# Patient Record
Sex: Male | Born: 1980 | Race: White | Hispanic: No | Marital: Single | Smoking: Former smoker
Health system: Southern US, Community
[De-identification: ages and names within clinical notes are randomized; demographics above are authoritative.]

## PROBLEM LIST (undated history)

## (undated) ENCOUNTER — Ambulatory Visit: Disposition: A | Discharge status: Home / Self Care | Payer: Managed Care, Other (non HMO)

## (undated) DIAGNOSIS — Z789 Other specified health status: Secondary | ICD-10-CM

## (undated) HISTORY — PX: PENECTOMY: SHX741

## (undated) HISTORY — DX: Other specified health status: Z78.9

---

## 2015-09-22 ENCOUNTER — Encounter (HOSPITAL_COMMUNITY): Payer: Self-pay | Admitting: Emergency Medicine

## 2015-09-22 DIAGNOSIS — R0789 Other chest pain: Secondary | ICD-10-CM | POA: Insufficient documentation

## 2015-09-22 DIAGNOSIS — F17200 Nicotine dependence, unspecified, uncomplicated: Secondary | ICD-10-CM | POA: Insufficient documentation

## 2015-09-22 NOTE — ED Notes (Signed)
Pt. reports central chest pain with SOB and emesis onset last night , denies diaphoresis .

## 2015-09-23 ENCOUNTER — Emergency Department (HOSPITAL_COMMUNITY): Payer: Self-pay

## 2015-09-23 ENCOUNTER — Emergency Department (HOSPITAL_COMMUNITY)
Admission: EM | Admit: 2015-09-23 | Discharge: 2015-09-23 | Disposition: A | Discharge status: Home / Self Care | Payer: Self-pay | Attending: Emergency Medicine | Admitting: Emergency Medicine

## 2015-09-23 DIAGNOSIS — R0789 Other chest pain: Secondary | ICD-10-CM

## 2015-09-23 LAB — D-DIMER, QUANTITATIVE: D-Dimer, Quant: <0.27 ug/mL-FEU (ref 0.00–0.50)

## 2015-09-23 LAB — HEPATIC FUNCTION PANEL
ALK PHOS: 48 U/L (ref 38–126)
ALT: 23 U/L (ref 17–63)
AST: 24 U/L (ref 15–41)
Albumin: 4.3 g/dL (ref 3.5–5.0)
BILIRUBIN DIRECT: 0.1 mg/dL (ref 0.1–0.5)
Indirect Bilirubin: 1.9 mg/dL — ABNORMAL HIGH (ref 0.3–0.9)
Total Bilirubin: 2 mg/dL — ABNORMAL HIGH (ref 0.3–1.2)
Total Protein: 7.3 g/dL (ref 6.5–8.1)

## 2015-09-23 LAB — CBC
HEMATOCRIT: 49.3 % (ref 39.0–52.0)
HEMOGLOBIN: 16.7 g/dL (ref 13.0–17.0)
MCH: 29.2 pg (ref 26.0–34.0)
MCHC: 33.9 g/dL (ref 30.0–36.0)
MCV: 86.2 fL (ref 78.0–100.0)
Platelets: 319 10*3/uL (ref 150–400)
RBC: 5.72 MIL/uL (ref 4.22–5.81)
RDW: 12.4 % (ref 11.5–15.5)
WBC: 12.4 10*3/uL — ABNORMAL HIGH (ref 4.0–10.5)

## 2015-09-23 LAB — BASIC METABOLIC PANEL
ANION GAP: 14 (ref 5–15)
BUN: 12 mg/dL (ref 6–20)
CHLORIDE: 103 mmol/L (ref 101–111)
CO2: 23 mmol/L (ref 22–32)
Calcium: 9.4 mg/dL (ref 8.9–10.3)
Creatinine, Ser: 1.01 mg/dL (ref 0.61–1.24)
GFR calc Af Amer: >60 mL/min (ref >60)
GLUCOSE: 84 mg/dL (ref 65–99)
POTASSIUM: 3.6 mmol/L (ref 3.5–5.1)
Sodium: 140 mmol/L (ref 135–145)

## 2015-09-23 LAB — I-STAT TROPONIN, ED: Troponin i, poc: 0 ng/mL (ref 0.00–0.08)

## 2015-09-23 LAB — LIPASE, BLOOD: Lipase: 24 U/L (ref 11–51)

## 2015-09-23 MED ORDER — KETOROLAC TROMETHAMINE 30 MG/ML IJ SOLN
30.0000 mg | Freq: Once | INTRAMUSCULAR | Status: AC
  Administered 2015-09-23: 30 mg via INTRAVENOUS
  Filled 2015-09-23: qty 1

## 2015-09-23 MED ORDER — IBUPROFEN 600 MG PO TABS: 600.0000 mg | ORAL_TABLET | Freq: Four times a day (QID) | ORAL | Status: DC | PRN

## 2015-09-23 NOTE — ED Notes (Signed)
Patient verbalized understanding of discharge instructions and denies any further needs or questions at this time. VS stable. Patient ambulatory with steady gait.  

## 2015-09-23 NOTE — ED Provider Notes (Signed)
CSN: [id number]     Arrival date & time 09/22/15  2340 History  By signing my name below, I, Bethel Born, attest that this documentation has been prepared under the direction and in the presence of Shon Baton, MD. Electronically Signed: Bethel Born, ED Scribe. 09/23/2015. 7:37 AM   Chief Complaint  Patient presents with  . Chest Pain    The history is provided by the patient. No language interpreter was used.   Leonard Barrett is a 35 y.o. male with no significant PMHx who presents to the Emergency Department complaining of constant, 5/10 in severity, central chest pain  with onset 2 nights ago. The pain is dull generally and sharp with deep breathing. Movement exacerbates the pain. He denies recent heavy lifting. Pt denies LE swelling. No personal history of cardiac disease. No family history of early heart disease. No history of DVT/PE.  The pt is a smoker. Denies regular alcohol use. No daily medication. He is allergic to Triaminic.   History reviewed. No pertinent past medical history. Past Surgical History  Procedure Laterality Date  . Penectomy     No family history on file. Social History  Substance Use Topics  . Smoking status: Current Every Day Smoker  . Smokeless tobacco: None  . Alcohol Use: Yes    Review of Systems  Constitutional: Negative for fever.  Respiratory: Negative for cough and shortness of breath.   Cardiovascular: Positive for chest pain. Negative for leg swelling.  All other systems reviewed and are negative.     Allergies  Triaminic fever reducer  Home Medications   Prior to Admission medications   Medication Sig Start Date End Date Taking? Authorizing Provider  ibuprofen (ADVIL,MOTRIN) 600 MG tablet Take 1 tablet (600 mg total) by mouth every 6 (six) hours as needed. 09/23/15   Shon Baton, MD   BP 125/84 mmHg  Pulse 85  Temp(Src) 97.8 F (36.6 C) (Oral)  Resp 18  Ht 6' (1.829 m)  Wt 203 lb 7 oz (92.279 kg)  BMI 27.59  kg/m2  SpO2 96% Physical Exam  Constitutional: He is oriented to person, place, and time. He appears well-developed and well-nourished. No distress.  HENT:  Head: Normocephalic and atraumatic.  Cardiovascular: Normal rate, regular rhythm and normal heart sounds.   No murmur heard. Pulmonary/Chest: Effort normal and breath sounds normal. No respiratory distress. He has no wheezes. He exhibits no tenderness.  Abdominal: Soft. Bowel sounds are normal. There is no tenderness. There is no rebound.  Musculoskeletal: He exhibits no edema.  Neurological: He is alert and oriented to person, place, and time.  Skin: Skin is warm and dry.  Psychiatric: He has a normal mood and affect.  Nursing note and vitals reviewed.   ED Course  Procedures (including critical care time) DIAGNOSTIC STUDIES: Oxygen Saturation is 96% on RA,  normal by my interpretation.    COORDINATION OF CARE: 3:48 AM Discussed treatment plan which includes  with pt at bedside and pt agreed to plan.  Labs Review Labs Reviewed  CBC - Abnormal; Notable for the following:    WBC 12.4 (*)    All other components within normal limits  HEPATIC FUNCTION PANEL - Abnormal; Notable for the following:    Total Bilirubin 2.0 (*)    Indirect Bilirubin 1.9 (*)    All other components within normal limits  BASIC METABOLIC PANEL  LIPASE, BLOOD  D-DIMER, QUANTITATIVE (NOT AT [facility])  Rosezena Sensor, ED    Imaging Review Dg  Chest 2 View  09/23/2015  CLINICAL DATA:  Left upper chest pain and left shoulder pain since yesterday. EXAM: CHEST  2 VIEW COMPARISON:  None. FINDINGS: The heart size and mediastinal contours are within normal limits. Both lungs are clear. The visualized skeletal structures are unremarkable. IMPRESSION: No active cardiopulmonary disease. Electronically Signed   By: Burman Nieves M.D.   On: 09/23/2015 00:46   I have personally reviewed and evaluated these images and lab results as part of my medical  decision-making.   EKG Interpretation   Date/Time:  Monday September 22 2015 23:49:03 EST Ventricular Rate:  89 PR Interval:  140 QRS Duration: 82 QT Interval:  350 QTC Calculation: 425 R Axis:   82 Text Interpretation:  Normal sinus rhythm T wave abnormality, consider  inferior ischemia Abnormal ECG No prior for comparison Confirmed by Tanzie Rothschild   MD, Raisa Ditto ([id number]) on 09/23/2015 5:11:07 AM      MDM   Final diagnoses:  Other chest pain    Patient presents for chest pain. Some elements suggestive of musculoskeletal pain is worse with movement; however it is not reproducible on exam. He is nontoxic. Vital signs are reassuring. EKG, troponin, chest x-ray all reassuring. Screening d-dimer is negative. Patient was given Toradol with some improvement of pain.  He is low risk for ACS. Suspect musculoskeletal versus inflammatory etiology. Discussed this with the patient.  After history, exam, and medical workup I feel the patient has been appropriately medically screened and is safe for discharge home. Pertinent diagnoses were discussed with the patient. Patient was given return precautions.  I personally performed the services described in this documentation, which was scribed in my presence. The recorded information has been reviewed and is accurate.    Shon Baton, MD 09/23/15 [phone]

## 2015-09-23 NOTE — Discharge Instructions (Signed)

## 2016-04-06 ENCOUNTER — Emergency Department
Admit: 2016-04-06 | Discharge: 2016-04-06 | Disposition: A | Discharge status: Home / Self Care | Payer: Self-pay | Attending: Emergency Medicine | Admitting: Emergency Medicine

## 2016-04-06 ENCOUNTER — Emergency Department: Payer: Self-pay

## 2016-04-06 ENCOUNTER — Encounter: Payer: Self-pay | Admitting: Emergency Medicine

## 2016-04-06 ENCOUNTER — Inpatient Hospital Stay
Admission: EM | Admit: 2016-04-06 | Discharge: 2016-04-07 | DRG: 948 | Disposition: A | Discharge status: Home / Self Care | Payer: Self-pay | Attending: Internal Medicine | Admitting: Internal Medicine

## 2016-04-06 DIAGNOSIS — I319 Disease of pericardium, unspecified: Secondary | ICD-10-CM | POA: Diagnosis present

## 2016-04-06 DIAGNOSIS — Z888 Allergy status to other drugs, medicaments and biological substances status: Secondary | ICD-10-CM

## 2016-04-06 DIAGNOSIS — F17210 Nicotine dependence, cigarettes, uncomplicated: Secondary | ICD-10-CM | POA: Diagnosis present

## 2016-04-06 DIAGNOSIS — I214 Non-ST elevation (NSTEMI) myocardial infarction: Secondary | ICD-10-CM | POA: Diagnosis present

## 2016-04-06 DIAGNOSIS — Z8249 Family history of ischemic heart disease and other diseases of the circulatory system: Secondary | ICD-10-CM

## 2016-04-06 DIAGNOSIS — I4891 Unspecified atrial fibrillation: Secondary | ICD-10-CM | POA: Diagnosis present

## 2016-04-06 DIAGNOSIS — R300 Dysuria: Secondary | ICD-10-CM | POA: Diagnosis present

## 2016-04-06 DIAGNOSIS — R748 Abnormal levels of other serum enzymes: Principal | ICD-10-CM | POA: Diagnosis present

## 2016-04-06 DIAGNOSIS — Z87440 Personal history of urinary (tract) infections: Secondary | ICD-10-CM

## 2016-04-06 LAB — APTT: aPTT: 35 seconds (ref 24–36)

## 2016-04-06 LAB — BASIC METABOLIC PANEL
ANION GAP: 10 (ref 5–15)
BUN: 13 mg/dL (ref 6–20)
CALCIUM: 8.9 mg/dL (ref 8.9–10.3)
CHLORIDE: 104 mmol/L (ref 101–111)
CO2: 24 mmol/L (ref 22–32)
Creatinine, Ser: 0.95 mg/dL (ref 0.61–1.24)
GFR calc non Af Amer: >60 mL/min (ref >60)
Glucose, Bld: 95 mg/dL (ref 65–99)
Potassium: 4.1 mmol/L (ref 3.5–5.1)
SODIUM: 138 mmol/L (ref 135–145)

## 2016-04-06 LAB — URINALYSIS COMPLETE WITH MICROSCOPIC (ARMC ONLY)
Bacteria, UA: NONE SEEN
Bilirubin Urine: NEGATIVE
Glucose, UA: NEGATIVE mg/dL
HGB URINE DIPSTICK: NEGATIVE
Nitrite: NEGATIVE
PH: 6 (ref 5.0–8.0)
PROTEIN: 100 mg/dL — AB
Specific Gravity, Urine: 1.029 (ref 1.005–1.030)

## 2016-04-06 LAB — URINE DRUG SCREEN, QUALITATIVE (ARMC ONLY)
Amphetamines, Ur Screen: NOT DETECTED
BARBITURATES, UR SCREEN: NOT DETECTED
BENZODIAZEPINE, UR SCRN: NOT DETECTED
CANNABINOID 50 NG, UR ~~LOC~~: POSITIVE — AB
COCAINE METABOLITE, UR ~~LOC~~: NOT DETECTED
MDMA (Ecstasy)Ur Screen: NOT DETECTED
METHADONE SCREEN, URINE: NOT DETECTED
OPIATE, UR SCREEN: NOT DETECTED
PHENCYCLIDINE (PCP) UR S: NOT DETECTED
Tricyclic, Ur Screen: NOT DETECTED

## 2016-04-06 LAB — CBC
HCT: 47.8 % (ref 40.0–52.0)
HEMOGLOBIN: 16.3 g/dL (ref 13.0–18.0)
MCH: 29.4 pg (ref 26.0–34.0)
MCHC: 34.2 g/dL (ref 32.0–36.0)
MCV: 86 fL (ref 80.0–100.0)
Platelets: 265 10*3/uL (ref 150–440)
RBC: 5.55 MIL/uL (ref 4.40–5.90)
RDW: 13.2 % (ref 11.5–14.5)
WBC: 14.8 10*3/uL — AB (ref 3.8–10.6)

## 2016-04-06 LAB — PROTIME-INR
INR: 1.04
Prothrombin Time: 13.6 seconds (ref 11.4–15.2)

## 2016-04-06 LAB — ECHOCARDIOGRAM COMPLETE
HEIGHTINCHES: 72 in
WEIGHTICAEL: 3104 [oz_av]

## 2016-04-06 LAB — CK: Total CK: 126 U/L (ref 49–397)

## 2016-04-06 LAB — TROPONIN I
TROPONIN I: 1.39 ng/mL — AB (ref <0.03)
Troponin I: 1.58 ng/mL (ref <0.03)

## 2016-04-06 LAB — HEPARIN LEVEL (UNFRACTIONATED): HEPARIN UNFRACTIONATED: 0.16 [IU]/mL — AB (ref 0.30–0.70)

## 2016-04-06 LAB — CKMB (ARMC ONLY): CK, MB: 5.4 ng/mL — AB (ref 0.5–5.0)

## 2016-04-06 MED ORDER — HEPARIN BOLUS VIA INFUSION
4000.0000 [IU] | Freq: Once | INTRAVENOUS | Status: AC
  Administered 2016-04-06: 4000 [IU] via INTRAVENOUS
  Filled 2016-04-06: qty 4000

## 2016-04-06 MED ORDER — KETOROLAC TROMETHAMINE 15 MG/ML IJ SOLN
15.0000 mg | Freq: Four times a day (QID) | INTRAMUSCULAR | Status: DC | PRN
  Administered 2016-04-06 (×2): 15 mg via INTRAVENOUS
  Filled 2016-04-06 (×2): qty 1

## 2016-04-06 MED ORDER — IBUPROFEN 400 MG PO TABS
400.0000 mg | ORAL_TABLET | Freq: Four times a day (QID) | ORAL | Status: DC | PRN
  Administered 2016-04-06: 400 mg via ORAL
  Filled 2016-04-06: qty 1

## 2016-04-06 MED ORDER — ASPIRIN EC 81 MG PO TBEC
81.0000 mg | DELAYED_RELEASE_TABLET | Freq: Every day | ORAL | Status: DC
  Administered 2016-04-07: 81 mg via ORAL
  Filled 2016-04-06: qty 1

## 2016-04-06 MED ORDER — ASPIRIN 81 MG PO CHEW
324.0000 mg | CHEWABLE_TABLET | Freq: Once | ORAL | Status: AC
  Administered 2016-04-06: 324 mg via ORAL
  Filled 2016-04-06: qty 4

## 2016-04-06 MED ORDER — ONDANSETRON HCL 4 MG/2ML IJ SOLN: 4.0000 mg | Freq: Four times a day (QID) | INTRAMUSCULAR | Status: DC | PRN

## 2016-04-06 MED ORDER — SODIUM CHLORIDE 0.9 % IV BOLUS (SEPSIS): 1000.0000 mL | Freq: Once | INTRAVENOUS | Status: DC

## 2016-04-06 MED ORDER — SODIUM CHLORIDE 0.9% FLUSH: 3.0000 mL | INTRAVENOUS | Status: DC | PRN

## 2016-04-06 MED ORDER — HEPARIN (PORCINE) IN NACL 100-0.45 UNIT/ML-% IJ SOLN
1450.0000 [IU]/h | INTRAMUSCULAR | Status: DC
  Administered 2016-04-06: 1200 [IU]/h via INTRAVENOUS
  Filled 2016-04-06 (×3): qty 250

## 2016-04-06 MED ORDER — ALBUTEROL SULFATE (2.5 MG/3ML) 0.083% IN NEBU: 2.5000 mg | INHALATION_SOLUTION | RESPIRATORY_TRACT | Status: DC | PRN

## 2016-04-06 MED ORDER — SODIUM CHLORIDE 0.9% FLUSH
3.0000 mL | Freq: Two times a day (BID) | INTRAVENOUS | Status: DC
  Administered 2016-04-06: 3 mL via INTRAVENOUS

## 2016-04-06 MED ORDER — SODIUM CHLORIDE 0.9 % IV SOLN: 250.0000 mL | INTRAVENOUS | Status: DC | PRN

## 2016-04-06 MED ORDER — POLYETHYLENE GLYCOL 3350 17 G PO PACK: 17.0000 g | PACK | Freq: Every day | ORAL | Status: DC | PRN

## 2016-04-06 MED ORDER — DIPHENHYDRAMINE HCL 25 MG PO CAPS
25.0000 mg | ORAL_CAPSULE | Freq: Every evening | ORAL | Status: DC | PRN
  Administered 2016-04-06 (×2): 25 mg via ORAL
  Filled 2016-04-06 (×2): qty 1

## 2016-04-06 MED ORDER — ONDANSETRON HCL 4 MG PO TABS: 4.0000 mg | ORAL_TABLET | Freq: Four times a day (QID) | ORAL | Status: DC | PRN

## 2016-04-06 MED ORDER — ATORVASTATIN CALCIUM 20 MG PO TABS
40.0000 mg | ORAL_TABLET | Freq: Every day | ORAL | Status: DC
  Administered 2016-04-06: 40 mg via ORAL
  Filled 2016-04-06: qty 2

## 2016-04-06 NOTE — Progress Notes (Signed)
*  PRELIMINARY RESULTS* Echocardiogram 2D Echocardiogram has been performed.  Cristela BlueHege, Lelah Rennaker 04/06/2016, 3:18 PM

## 2016-04-06 NOTE — ED Provider Notes (Signed)
[facility] Emergency Department Provider Note  _____________________________________   First MD Initiated Contact with Patient 04/06/16 1427     (approximate)  I have reviewed the triage vital signs and the nursing notes.   HISTORY  Chief Complaint Chest Pain  HPI Leonard Barrett is a 35 y.o. male who is previously healthy until 3 nights ago when he developed headache, neck ache, chest discomfort into his back; it made it so he was unable to sleep and he developed cold sweats at night. The following day he noted that he just had chest discomfort that is worse with breathing or moving. Laying flat makes it somewhat better however there is constant pressure there. He has not had similar symptoms in the past.  [facility] for [facility] and was treated with Zithromax and presumably ceftriaxone intramuscular.  IHistory reviewed. No pertinent past medical history.  Patient Active Problem List   Diagnosis Date Noted  . NSTEMI (non-ST elevated myocardial infarction) (HCC) 04/06/2016    Past Surgical History:  Procedure Laterality Date  . PENECTOMY      Prior to Admission medications   Not on File    Allergies Triaminic fever reducer [acetaminophen]  Family History  Problem Relation Age of Onset  . CAD Neg Hx     Social History Social History  Substance Use Topics  . Smoking status: Current Every Day Smoker    Packs/day: 0.50  . Smokeless tobacco: Never Used  . Alcohol use Yes     Comment: rarely    Review of Systems Constitutional: chills/malaise Eyes: No visual changes. ENT: No sore throat. Cardiovascular: see hpi Respiratory: Denies shortness of breath. Gastrointestinal: No abdominal pain. Genitourinary: see hpi Musculoskeletal:no LE painSkin: Negative for rash. Neurological: see hpi 10-point ROS otherwise negative.  ____________________________________________   PHYSICAL EXAM:  VITAL SIGNS: ED Triage Vitals    Enc Vitals Group     BP 04/06/16 1327 116/76     Pulse Rate 04/06/16 1327 100     Resp 04/06/16 1327 20     Temp 04/06/16 1327 98.8 F (37.1 C)     Temp Source 04/06/16 1327 Oral     SpO2 04/06/16 1327 100 %     Weight 04/06/16 1328 194 lb (88 kg)     Height 04/06/16 1328 6' (1.829 m)     Head Circumference --      Peak Flow --      Pain Score 04/06/16 1328 7     Pain Loc --      Pain Edu? --      Excl. in GC? --   Constitutional: Alert and oriented. Well appearing and in no acute distress. Eyes: Conjunctivae are normal. PERRL. EOMI. Head: Atraumatic. Nose: No congestion/rhinnorhea. Mouth/Throat: Mucous membranes are moist.  Oropharynx non-erythematous. Neck: No stridor.Cardiovascular: Normal rate, regular rhythm. Grossly normal heart sounds.  Good peripheral circulation. Respiratory: Normal respiratory effort.  No retractions. Lungs CTAB. Gastrointestinal: Soft and nontender. No distention. No abdominal bruits. No CVA tenderness. Musculoskeletal: No lower extremity tenderness nor edema.  No joint effusions. Neurologic:  Normal speech and language. No gross focal neurologic deficits are appreciated. No gait instability. Skin:  Skin is warm, dry and intact. No rash noted. Psychiatric: Mood and affect are normal. Speech and behavior are normal.  ____________________________________________   LABS (all labs ordered are listed, but only abnormal results are displayed)  Labs Reviewed  CBC - Abnormal; Notable for the following:  Result Value   WBC 14.8 (*)    All other components within normal limits  TROPONIN I - Abnormal; Notable for the following:    Troponin I 1.39 (*)    All other components within normal limits  CKMB([facility] ONLY) - Abnormal; Notable for the following:    CK, MB 5.4 (*)    All other components within normal limits  URINE DRUG SCREEN, QUALITATIVE ([facility] ONLY) - Abnormal; Notable for the following:    Cannabinoid 50 Ng, Ur [place] POSITIVE (*)    All other  components within normal limits  TROPONIN I - Abnormal; Notable for the following:    Troponin I 1.58 (*)    All other components within normal limits  URINALYSIS COMPLETEWITH MICROSCOPIC ([facility] ONLY) - Abnormal; Notable for the following:    Color, Urine AMBER (*)    APPearance CLEAR (*)    Ketones, ur 1+ (*)    Protein, ur 100 (*)    Leukocytes, UA 1+ (*)    Squamous Epithelial / LPF 0-5 (*)    All other components within normal limits  URINE CULTURE  BASIC METABOLIC PANEL  APTT  CK  PROTIME-INR  TROPONIN I  HEPARIN LEVEL (UNFRACTIONATED)  CBC  BASIC METABOLIC PANEL   ____________________________________________  EKG  ED ECG REPORT I, Maurilio LovelyMcLaurin,  Elantra Caprara, the attending physician, personally viewed and interpreted this ECG.   Date: 04/06/2016  EKG Time:1325  Rate: 89  Rhythm: nsr with premature supraventricular complexes;  QRS: q v1-2  Axis: nl  Intervals: nl  ST&T Change:nl  ____________________________________________  RADIOLOGY  cxr-nad ____________________________________________    Procedures   ____________________________________________   INITIAL IMPRESSION / ASSESSMENT AND PLAN / ED COURSE  Pertinent labs & imaging results that were available during my care of the patient were reviewed by me and considered in my medical decision making (see chart for details).  Patient with no risk factors for coronary artery disease, atypical chest pain, elevated troponin; I am concerned that patient could possibly have myocarditis or pericarditis; PE is also in the differential diagnosis though he also does not have risk factors; therefore I will order a stat echocardiogram for further evaluation and plan to admit to medicine.  Clinical Course   Dr. Elpidio AnisSudini d/w Dr. Khan/cardiology; will start on heparin gtt for presumed NSTEMI.  ____________________________________________   FINAL CLINICAL IMPRESSION(S) / ED DIAGNOSES  Final diagnoses:  NSTEMI (non-ST  elevated myocardial infarction) (HCC)      NEW MEDICATIONS STARTED DURING THIS VISIT:  There are no discharge medications for this patient.    Note:  This document was prepared using Dragon voice recognition software and may include unintentional dictation errors.   Maurilio LovelyNoelle Lilas Diefendorf, MD 04/06/16 2120

## 2016-04-06 NOTE — ED Notes (Signed)
Still in echo, room 36 ED

## 2016-04-06 NOTE — ED Notes (Signed)
ECHO tech moved pt into room for echo to be done.

## 2016-04-06 NOTE — Progress Notes (Signed)
ANTICOAGULATION CONSULT NOTE - Initial Consult  Pharmacy Consult for Heparin  Indication: chest pain/ACS  Allergies  Allergen Reactions  . Triaminic Fever Reducer [Acetaminophen] Other (See Comments)    unknown    Patient Measurements: Height: 6' (182.9 cm) Weight: 194 lb (88 kg) IBW/kg (Calculated) : 77.6 Heparin Dosing Weight: 88 kg   Vital Signs: Temp: 98.8 F (37.1 C) (08/29 1327) Temp Source: Oral (08/29 1327) BP: 111/69 (08/29 1626) Pulse Rate: 103 (08/29 1626)  Labs:  Recent Labs  04/06/16 1331  HGB 16.3  HCT 47.8  PLT 265  APTT 35  CREATININE 0.95  CKTOTAL 126  CKMB 5.4*  TROPONINI 1.39*    Estimated Creatinine Clearance: 122.5 mL/min (by C-G formula based on SCr of 0.95 mg/dL).   Medical History: History reviewed. No pertinent past medical history.  Medications:  Scheduled:  . [START ON 04/07/2016] aspirin EC  81 mg Oral Daily  . atorvastatin  40 mg Oral q1800  . heparin  4,000 Units Intravenous Once  . sodium chloride flush  3 mL Intravenous Q12H  . sodium chloride flush  3 mL Intravenous Q12H    Assessment: Pharmacy consulted to dose heparin in this 35 year old male admitted with chest pain.  No prior anticoag noted.  CrCl = 122.5 ml/min  Goal of Therapy:  Heparin level 0.3-0.7 units/ml Monitor platelets by anticoagulation protocol: Yes   Plan:  Give 4000 units bolus x 1 Start heparin infusion at 1200 units/hr  Will draw 1st HL 6 hrs after start of drip.  Will order baseline INR.   Jonah Gingras D 04/06/2016,4:34 PM

## 2016-04-06 NOTE — H&P (Signed)
[facility] [place] [facility]   PATIENT NAME: Leonard Barrett    MR#:  [id number]  DATE OF BIRTH:  1980/10/31  DATE OF ADMISSION:  04/06/2016  PRIMARY CARE PHYSICIAN: No PCP Per Patient   REQUESTING/REFERRING PHYSICIAN: Dr. Glenetta Hew  CHIEF COMPLAINT:   Chief Complaint  Patient presents with  . Chest Pain   HISTORY OF PRESENT ILLNESS:  Leonard Barrett  is a 35 y.o. male with No significant past medical history presents to the emergency room complaining of chest pain on the left side for 3 days. He initially noticed some neck pain chest pain and back pain which localized to his left chest. [facility] and was diagnosed with [facility]TI and treated with antibiotics for 3 days. He continued to feel bad along with the chest pain and presented to the emergency room. Here his EKG showed anterior Q waves with no acute changes. Troponin elevated at 1.39 and patient is being admitted to the hospitalist service. He states his pain is improved on laying down and worse with sitting up.  No history of DVT or CAD.  No premature CAD in the family.  PAST MEDICAL HISTORY:  History reviewed. No pertinent past medical history.  PAST SURGICAL HISTORY:   Past Surgical History:  Procedure Laterality Date  . PENECTOMY      SOCIAL HISTORY:   Social History  Substance Use Topics  . Smoking status: Current Every Day Smoker    Packs/day: 0.50  . Smokeless tobacco: Never Used  . Alcohol use Yes     Comment: rarely    FAMILY HISTORY:   Family History  Problem Relation Age of Onset  . CAD Neg Hx     DRUG ALLERGIES:   Allergies  Allergen Reactions  . Triaminic Fever Reducer [Acetaminophen] Other (See Comments)    unknown    REVIEW OF SYSTEMS:   Review of Systems  Constitutional: Positive for malaise/fatigue. Negative for chills, fever and weight loss.  HENT: Negative for hearing loss and nosebleeds.   Eyes: Negative for blurred vision, double vision and  pain.  Respiratory: Negative for cough, hemoptysis, sputum production, shortness of breath and wheezing.   Cardiovascular: Positive for chest pain. Negative for palpitations, orthopnea and leg swelling.  Gastrointestinal: Negative for abdominal pain, constipation, diarrhea, nausea and vomiting.  Genitourinary: Negative for dysuria and hematuria.  Musculoskeletal: Negative for back pain, falls and myalgias.  Skin: Negative for rash.  Neurological: Negative for dizziness, tremors, sensory change, speech change, focal weakness, seizures and headaches.  Endo/Heme/Allergies: Does not bruise/bleed easily.  Psychiatric/Behavioral: Negative for depression and memory loss. The patient is not nervous/anxious.    MEDICATIONS AT HOME:   Prior to Admission medications   Not on File    VITAL SIGNS:  Blood pressure 111/69, pulse (!) 103, temperature 98.8 F (37.1 C), temperature source Oral, resp. rate 17, height 6' (1.829 m), weight 88 kg (194 lb), SpO2 99 %.  PHYSICAL EXAMINATION:  Physical Exam  GENERAL:  35 y.o.-year-old patient lying in the bed with no acute distress.  EYES: Pupils equal, round, reactive to light and accommodation. No scleral icterus. Extraocular muscles intact.  HEENT: Head atraumatic, normocephalic. Oropharynx and nasopharynx clear. No oropharyngeal erythema, moist oral mucosa  NECK:  Supple, no jugular venous distention. No thyroid enlargement, no tenderness.  LUNGS: Normal breath sounds bilaterally, no wheezing, rales, rhonchi. No use of accessory muscles of respiration.  CARDIOVASCULAR: S1, S2 normal. No murmurs, rubs, or gallops.  ABDOMEN: Soft, nontender, nondistended. Bowel sounds present. No organomegaly or mass.  EXTREMITIES: No pedal edema, cyanosis, or clubbing. + 2 pedal & radial pulses b/l.   NEUROLOGIC: Cranial nerves II through XII are intact. No focal Motor or sensory deficits appreciated b/l PSYCHIATRIC: The patient is alert and oriented x 3. Good affect.   SKIN: No obvious rash, lesion, or ulcer.   LABORATORY PANEL:   CBC  Recent Labs Lab 04/06/16 1331  WBC 14.8*  HGB 16.3  HCT 47.8  PLT 265   ------------------------------------------------------------------------------------------------------------------ Chemistries   Recent Labs Lab 04/06/16 1331  NA 138  K 4.1  CL 104  CO2 24  GLUCOSE 95  BUN 13  CREATININE 0.95  CALCIUM 8.9   ------------------------------------------------------------------------------------------------------------------  Cardiac Enzymes  Recent Labs Lab 04/06/16 1331  TROPONINI 1.39*   ------------------------------------------------------------------------------------------------------------------  RADIOLOGY:  Dg Chest 2 View  Result Date: 04/06/2016 CLINICAL DATA:  Shortness breath and back pain started Saturday. EXAM: CHEST  2 VIEW COMPARISON:  None. FINDINGS: The heart size and mediastinal contours are within normal limits. Both lungs are clear. The visualized skeletal structures are unremarkable. IMPRESSION: No active cardiopulmonary disease. Electronically Signed   By: Kennith CenterEric  Mansell M.D.   On: 04/06/2016 14:01   IMPRESSION AND PLAN:   * NSTEMI Elevated troponin with left sided chest pain. No significant risk factors. Discussed with cardiology Dr. Welton FlakesKhan. We'll start on heparin drip, aspirin and statin. Trend troponin. Will likely need cardiac catheterization tomorrow. If troponin is stable may consider stress test. Check echocardiogram Check urine drug screen D-dimer is normal  * Dysuria Patient was recently treated for UTI. His symptoms are improved but still has mild symptoms. We'll check a urine analysis.  * DVT prophylaxis Patient will be on heparin drip  All the records are reviewed and case discussed with ED provider. Management plans discussed with the patient, family and they are in agreement.  CODE STATUS: FULL CODE  TOTAL TIME TAKING CARE OF THIS PATIENT: 40  minutes.   Milagros LollSudini, Mercy Malena R M.D on 04/06/2016 at 4:36 PM  Between 7am to 6pm - Pager - [phone]  After 6pm go to [email or web address] - password [email or web address] [email or web address]  [facility]  Office  [phone]  CC: Primary care physician; No PCP Per Patient  Note: This dictation was prepared with Dragon dictation along with smaller phrase technology. Any transcriptional errors that result from this process are unintentional.

## 2016-04-06 NOTE — ED Notes (Signed)
RN to room where echo done.  Pt reports echo was complete one hr ago. Pt returned to hall 17 bed and placed back on monitor.

## 2016-04-06 NOTE — Progress Notes (Signed)
Notified Dr Anne HahnWIllis of patient request for a sleep aid. Benedryl ordered and given.

## 2016-04-06 NOTE — Progress Notes (Signed)
Patient admitted to unit from ER for chest pain, patient alert and oriented, Toradol  15 mg administer for pain 7/10, heparin drip 12 ml /hr, patient oriented to call light and phone system.

## 2016-04-06 NOTE — ED Notes (Signed)
Called pharmacy for heparin 

## 2016-04-06 NOTE — Progress Notes (Signed)
Leonard Barrett is a 35 y.o. male  [id number]  Primary Cardiologist: Adrian Blackwater Reason for Consultation: Chest pain  HPI: This is a 35 year old white male with a past medical history ofsignificant medical problem presented to the hospital with chest pain described as pressure type and intermittent pleuritic chest pain. His troponin was mildly elevated thus I was asked to evaluate the patient and patient still has intermittent chest pain.   Review of Systems: No orthopnea PND or leg swelling   History reviewed. No pertinent past medical history.   (Not in a hospital admission)     Infusions:   Allergies  Allergen Reactions  . Triaminic Fever Reducer [Acetaminophen] Other (See Comments)    unknown    Social History   Social History  . Marital status: Single    Spouse name: N/A  . Number of children: N/A  . Years of education: N/A   Occupational History  . Not on file.   Social History Main Topics  . Smoking status: Current Every Day Smoker    Packs/day: 0.50  . Smokeless tobacco: Never Used  . Alcohol use Yes     Comment: rarely  . Drug use:     Types: Marijuana  . Sexual activity: Not on file   Other Topics Concern  . Not on file   Social History Narrative  . No narrative on file    History reviewed. No pertinent family history.  PHYSICAL EXAM: Vitals:   04/06/16 1327 04/06/16 1425  BP: 116/76 (!) 146/94  Pulse: 100 (!) 101  Resp: 20 17  Temp: 98.8 F (37.1 C)     No intake or output data in the 24 hours ending 04/06/16 1615  General:  Well appearing. No respiratory difficulty HEENT: normal Neck: supple. no JVD. Carotids 2+ bilat; no bruits. No lymphadenopathy or thryomegaly appreciated. Cor: PMI nondisplaced. Regular rate & rhythm. No rubs, gallops or murmurs. Lungs: clear Abdomen: soft, nontender, nondistended. No hepatosplenomegaly. No bruits or masses. Good bowel sounds. Extremities: no cyanosis, clubbing, rash, edema Neuro: alert &  oriented x 3, cranial nerves grossly intact. moves all 4 extremities w/o difficulty. Affect pleasant.  NWG:NFAOZ rhythm with occasional PVC nonspecific ST-T changes  Results for orders placed or performed during the hospital encounter of 04/06/16 (from the past 24 hour(s))  Basic metabolic panel     Status: None   Collection Time: 04/06/16  1:31 PM  Result Value Ref Range   Sodium 138 135 - 145 mmol/L   Potassium 4.1 3.5 - 5.1 mmol/L   Chloride 104 101 - 111 mmol/L   CO2 24 22 - 32 mmol/L   Glucose, Bld 95 65 - 99 mg/dL   BUN 13 6 - 20 mg/dL   Creatinine, Ser 3.08 0.61 - 1.24 mg/dL   Calcium 8.9 8.9 - 65.7 mg/dL   GFR calc non Af Amer >60 >60 mL/min   GFR calc Af Amer >60 >60 mL/min   Anion gap 10 5 - 15  CBC     Status: Abnormal   Collection Time: 04/06/16  1:31 PM  Result Value Ref Range   WBC 14.8 (H) 3.8 - 10.6 K/uL   RBC 5.55 4.40 - 5.90 MIL/uL   Hemoglobin 16.3 13.0 - 18.0 g/dL   HCT 84.6 96.2 - 95.2 %   MCV 86.0 80.0 - 100.0 fL   MCH 29.4 26.0 - 34.0 pg   MCHC 34.2 32.0 - 36.0 g/dL   RDW 84.1 32.4 - 40.1 %  Platelets 265 150 - 440 K/uL  Troponin I     Status: Abnormal   Collection Time: 04/06/16  1:31 PM  Result Value Ref Range   Troponin I 1.39 (HH) <0.03 ng/mL  APTT     Status: None   Collection Time: 04/06/16  1:31 PM  Result Value Ref Range   aPTT 35 24 - 36 seconds  CKMB([facility] only)     Status: Abnormal   Collection Time: 04/06/16  1:31 PM  Result Value Ref Range   CK, MB 5.4 (H) 0.5 - 5.0 ng/mL  CK     Status: None   Collection Time: 04/06/16  1:31 PM  Result Value Ref Range   Total CK 126 49 - 397 U/L   Dg Chest 2 View  Result Date: 04/06/2016 CLINICAL DATA:  Shortness breath and back pain started Saturday. EXAM: CHEST  2 VIEW COMPARISON:  None. FINDINGS: The heart size and mediastinal contours are within normal limits. Both lungs are clear. The visualized skeletal structures are unremarkable. IMPRESSION: No active cardiopulmonary disease.  Electronically Signed   By: Kennith CenterEric  Mansell M.D.   On: 04/06/2016 14:01     ASSESSMENT AND PLAN:Atypical chest pain with mildly elevated troponin of 1.39 and some component of pleuritic chest pain with echo showing normal wall motion and normal ejection fraction. Advise admitting overnight with starting heparin and aspirin and if troponin doesn't go up much higher will do CTA coronaries in the office after discharging the patient tomorrow. Patient can be discharged on isosorbide aspirin.  Swayzie Choate A

## 2016-04-06 NOTE — ED Triage Notes (Signed)
Pt presents with chest pain x 3 days. Pt states he was recently treated for uti/std. Pt reports that he has some SOB with the chest pain. He denies hx of anxiety, although he states that he awakened with night sweats last night, which has never happened before. Pt alert & oriented. NAD noted.

## 2016-04-06 NOTE — ED Notes (Signed)
Pt notified of need urine sample

## 2016-04-07 LAB — BASIC METABOLIC PANEL
ANION GAP: 8 (ref 5–15)
BUN: 14 mg/dL (ref 6–20)
CALCIUM: 8.3 mg/dL — AB (ref 8.9–10.3)
CO2: 22 mmol/L (ref 22–32)
CREATININE: 0.8 mg/dL (ref 0.61–1.24)
Chloride: 108 mmol/L (ref 101–111)
GFR calc Af Amer: >60 mL/min (ref >60)
GLUCOSE: 100 mg/dL — AB (ref 65–99)
Potassium: 3.9 mmol/L (ref 3.5–5.1)
Sodium: 138 mmol/L (ref 135–145)

## 2016-04-07 LAB — CBC
HCT: 45.7 % (ref 40.0–52.0)
HEMOGLOBIN: 15.9 g/dL (ref 13.0–18.0)
MCH: 29.6 pg (ref 26.0–34.0)
MCHC: 34.8 g/dL (ref 32.0–36.0)
MCV: 85.1 fL (ref 80.0–100.0)
PLATELETS: 230 10*3/uL (ref 150–440)
RBC: 5.37 MIL/uL (ref 4.40–5.90)
RDW: 13.1 % (ref 11.5–14.5)
WBC: 11.7 10*3/uL — ABNORMAL HIGH (ref 3.8–10.6)

## 2016-04-07 LAB — HEPARIN LEVEL (UNFRACTIONATED): HEPARIN UNFRACTIONATED: 0.41 [IU]/mL (ref 0.30–0.70)

## 2016-04-07 LAB — TROPONIN I: Troponin I: 0.88 ng/mL (ref <0.03)

## 2016-04-07 MED ORDER — METOPROLOL TARTRATE 25 MG PO TABS: 25.0000 mg | ORAL_TABLET | Freq: Two times a day (BID) | ORAL | Status: DC

## 2016-04-07 MED ORDER — HYDROCODONE-ACETAMINOPHEN 5-325 MG PO TABS
1.0000 | ORAL_TABLET | ORAL | Status: DC | PRN
  Filled 2016-04-07: qty 1

## 2016-04-07 MED ORDER — HEPARIN BOLUS VIA INFUSION
2600.0000 [IU] | Freq: Once | INTRAVENOUS | Status: AC
  Administered 2016-04-07: 2600 [IU] via INTRAVENOUS
  Filled 2016-04-07: qty 2600

## 2016-04-07 MED ORDER — METOPROLOL TARTRATE 100 MG PO TABS
100.0000 mg | ORAL_TABLET | Freq: Once | ORAL | Status: AC
  Administered 2016-04-07: 100 mg via ORAL
  Filled 2016-04-07: qty 1

## 2016-04-07 MED ORDER — ACETAMINOPHEN 325 MG PO TABS: 650.0000 mg | ORAL_TABLET | Freq: Four times a day (QID) | ORAL | Status: DC | PRN

## 2016-04-07 MED ORDER — MORPHINE SULFATE (PF) 2 MG/ML IV SOLN: 2.0000 mg | INTRAVENOUS | Status: DC | PRN

## 2016-04-07 NOTE — Progress Notes (Addendum)
SUBJECTIVE: Pt feeling better this am, no chest pain and breathing is better at this time. He did have some waves of chest pressure over night. No palpitations, but he does sweat profusely in his sleep.   Vitals:   04/06/16 1640 04/06/16 1648 04/06/16 1720 04/07/16 0536  BP: 121/75 109/76 123/68 (!) 94/57  Pulse: 97 (!) 105 97 84  Resp: 17 19 16 16   Temp:   99 F (37.2 C)   TempSrc:   Oral   SpO2: 98% 98% 100% 97%  Weight:   188 lb 6.4 oz (85.5 kg) 186 lb 12.8 oz (84.7 kg)  Height:   6' (1.829 m)     Intake/Output Summary (Last 24 hours) at 04/07/16 0847 Last data filed at 04/07/16 0549  Gross per 24 hour  Intake                0 ml  Output              900 ml  Net             -900 ml    LABS: Basic Metabolic Panel:  Recent Labs  04/54/0908/29/17 1331 04/07/16 0653  NA 138 138  K 4.1 3.9  CL 104 108  CO2 24 22  GLUCOSE 95 100*  BUN 13 14  CREATININE 0.95 0.80  CALCIUM 8.9 8.3*   Liver Function Tests: No results for input(s): AST, ALT, ALKPHOS, BILITOT, PROT, ALBUMIN in the last 72 hours. No results for input(s): LIPASE, AMYLASE in the last 72 hours. CBC:  Recent Labs  04/06/16 1331 04/07/16 0653  WBC 14.8* 11.7*  HGB 16.3 15.9  HCT 47.8 45.7  MCV 86.0 85.1  PLT 265 230   Cardiac Enzymes:  Recent Labs  04/06/16 1331 04/06/16 1911 04/07/16 0053  CKTOTAL 126  --   --   CKMB 5.4*  --   --   TROPONINI 1.39* 1.58* 0.88*   BNP: Invalid input(s): POCBNP D-Dimer: No results for input(s): DDIMER in the last 72 hours. Hemoglobin A1C: No results for input(s): HGBA1C in the last 72 hours. Fasting Lipid Panel: No results for input(s): CHOL, HDL, LDLCALC, TRIG, CHOLHDL, LDLDIRECT in the last 72 hours. Thyroid Function Tests: No results for input(s): TSH, T4TOTAL, T3FREE, THYROIDAB in the last 72 hours.  Invalid input(s): FREET3 Anemia Panel: No results for input(s): VITAMINB12, FOLATE, FERRITIN, TIBC, IRON, RETICCTPCT in the last 72 hours.   PHYSICAL  EXAM General: Well developed, well nourished, in no acute distress HEENT:  Normocephalic and atramatic Neck:  No JVD.  Lungs: Clear bilaterally to auscultation and percussion. Heart: HRRR with occ irregular beat . Normal S1 and S2 without gallops or murmurs.  Abdomen: Bowel sounds are positive, abdomen soft and non-tender  Msk:  Back normal, normal gait. Normal strength and tone for age. Extremities: No clubbing, cyanosis or edema.   Neuro: Alert and oriented X 3. Psych:  Good affect, responds appropriately  TELEMETRY: Sinus rhythm with frequent PAC's, rates 80's and 90's. In and out of atrial fib this morning for about 10 minutes with rates up to 130's  ASSESSMENT AND PLAN: Chest pain, shortness of breath and elevated troponins in young male patient with only major risk factor is smoking. He has had sinus rhythm with PAC's on the tele monitor and this morning at 06:25 he had about 10 minutes of intermittent atrial fib in the 130's. Will add beta blocker. Will discuss with Dr. Welton FlakesKhan.  Active Problems:   NSTEMI (non-ST  elevated myocardial infarction) [facility])    [name], NP 04/07/2016 8:47 AM   After speaking with Dr. Welton Flakes advise discharge and will do CCTA in the office today. Could be myopericarditis.  Will give metoprolol tartrate 100 mg for heart rate control for testing.

## 2016-04-07 NOTE — Progress Notes (Signed)
Berton BonJanine Hammond NP notified. Pt states heparin gtt is suppose to be discontinued. Orders received. I will continue to assess.

## 2016-04-07 NOTE — Progress Notes (Signed)
ANTICOAGULATION CONSULT NOTE - Initial Consult  Pharmacy Consult for Heparin  Indication: chest pain/ACS  Allergies  Allergen Reactions  . Triaminic Fever Reducer [Acetaminophen] Other (See Comments)    unknown    Patient Measurements: Height: 6' (182.9 cm) Weight: 188 lb 6.4 oz (85.5 kg) IBW/kg (Calculated) : 77.6 Heparin Dosing Weight: 88 kg   Vital Signs: Temp: 99 F (37.2 C) (08/29 1720) Temp Source: Oral (08/29 1720) BP: 123/68 (08/29 1720) Pulse Rate: 97 (08/29 1720)  Labs:  Recent Labs  04/06/16 1331 04/06/16 1911 04/06/16 2254  HGB 16.3  --   --   HCT 47.8  --   --   PLT 265  --   --   APTT 35  --   --   LABPROT  --  13.6  --   INR  --  1.04  --   HEPARINUNFRC  --   --  0.16*  CREATININE 0.95  --   --   CKTOTAL 126  --   --   CKMB 5.4*  --   --   TROPONINI 1.39* 1.58*  --     Estimated Creatinine Clearance: 122.5 mL/min (by C-G formula based on SCr of 0.95 mg/dL).   Medical History: History reviewed. No pertinent past medical history.  Medications:  Scheduled:  . aspirin EC  81 mg Oral Daily  . atorvastatin  40 mg Oral q1800  . heparin  2,600 Units Intravenous Once  . sodium chloride  1,000 mL Intravenous Once  . sodium chloride flush  3 mL Intravenous Q12H  . sodium chloride flush  3 mL Intravenous Q12H    Assessment: Pharmacy consulted to dose heparin in this 35 year old male admitted with chest pain.  No prior anticoag noted.  CrCl = 122.5 ml/min  Goal of Therapy:  Heparin level 0.3-0.7 units/ml Monitor platelets by anticoagulation protocol: Yes   Plan:  First heparin level subtherapeutic. 2600 units IV x 1 bolus and increase rate to 1450 units/hr. Will recheck heparin level in 6 hours.  Carola FrostNathan A Harrell Niehoff, Pharm.D., BCPS Clinical Pharmacist 04/07/2016,12:55 AM

## 2016-04-07 NOTE — Discharge Summary (Signed)
[facility] [place] [facility]   PATIENT NAME: Leonard Barrett    MR#:  [id number]  DATE OF BIRTH:  1980-11-17  DATE OF ADMISSION:  04/06/2016 ADMITTING PHYSICIAN: Milagros Loll, MD  DATE OF DISCHARGE: 04/07/16  PRIMARY CARE PHYSICIAN: No PCP Per Patient    ADMISSION DIAGNOSIS:  chest pain  DISCHARGE DIAGNOSIS:  Elevated troponin myopericarditis  SECONDARY DIAGNOSIS:  History reviewed. No pertinent past medical history.  HOSPITAL COURSE:  Leonard Barrett  is a 35 y.o. male admitted 04/06/2016 with chief complaint Chest Pain . Please see H&P performed by Milagros Loll, MD for further information. Patient presented with chest pain, found to have elevated troponin started on anticoagulation for suspected NSTEMI. Patient evaluated by cardiology deemed NSTEMI unlikely, and planning on doing cardiac CT today in the office for myopericarditis.   DISCHARGE CONDITIONS:   stable  CONSULTS OBTAINED:  Treatment Team:  Laurier Nancy, MD  DRUG ALLERGIES:   Allergies  Allergen Reactions  . Triaminic Fever Reducer [Acetaminophen] Rash    Patient reports that he takes tylenol (acetaminophen) without a problem    DISCHARGE MEDICATIONS:  There are no discharge medications for this patient.    DISCHARGE INSTRUCTIONS:    DIET:  Regular diet  DISCHARGE CONDITION:  Good  ACTIVITY:  Activity as tolerated  OXYGEN:  Home Oxygen: No.   Oxygen Delivery: room air  DISCHARGE LOCATION:  home   If you experience worsening of your admission symptoms, develop shortness of breath, life threatening emergency, suicidal or homicidal thoughts you must seek medical attention immediately by calling 911 or calling your MD immediately  if symptoms less severe.  You Must read complete instructions/literature along with all the possible adverse reactions/side effects for all the Medicines you take and that have been prescribed to you. Take any new Medicines after you have  completely understood and accpet all the possible adverse reactions/side effects.   Please note  You were cared for by a hospitalist during your hospital stay. If you have any questions about your discharge medications or the care you received while you were in the hospital after you are discharged, you can call the unit and asked to speak with the hospitalist on call if the hospitalist that took care of you is not available. Once you are discharged, your primary care physician will handle any further medical issues. Please note that NO REFILLS for any discharge medications will be authorized once you are discharged, as it is imperative that you return to your primary care physician (or establish a relationship with a primary care physician if you do not have one) for your aftercare needs so that they can reassess your need for medications and monitor your lab values.    On the day of Discharge:   VITAL SIGNS:  Blood pressure 111/68, pulse 86, temperature 99 F (37.2 C), temperature source Oral, resp. rate 16, height 6' (1.829 m), weight 84.7 kg (186 lb 12.8 oz), SpO2 97 %.  I/O:   Intake/Output Summary (Last 24 hours) at 04/07/16 1048 Last data filed at 04/07/16 0549  Gross per 24 hour  Intake                0 ml  Output              900 ml  Net             -900 ml    PHYSICAL EXAMINATION:  GENERAL:  35 y.o.-year-old patient lying in  the bed with no acute distress.  EYES: Pupils equal, round, reactive to light and accommodation. No scleral icterus. Extraocular muscles intact.  HEENT: Head atraumatic, normocephalic. Oropharynx and nasopharynx clear.  NECK:  Supple, no jugular venous distention. No thyroid enlargement, no tenderness.  LUNGS: Normal breath sounds bilaterally, no wheezing, rales,rhonchi or crepitation. No use of accessory muscles of respiration.  CARDIOVASCULAR: S1, S2 normal. No murmurs, rubs, or gallops.  ABDOMEN: Soft, non-tender, non-distended. Bowel sounds present.  No organomegaly or mass.  EXTREMITIES: No pedal edema, cyanosis, or clubbing.  NEUROLOGIC: Cranial nerves II through XII are intact. Muscle strength 5/5 in all extremities. Sensation intact. Gait not checked.  PSYCHIATRIC: The patient is alert and oriented x 3.  SKIN: No obvious rash, lesion, or ulcer.   DATA REVIEW:   CBC  Recent Labs Lab 04/07/16 0653  WBC 11.7*  HGB 15.9  HCT 45.7  PLT 230    Chemistries   Recent Labs Lab 04/07/16 0653  NA 138  K 3.9  CL 108  CO2 22  GLUCOSE 100*  BUN 14  CREATININE 0.80  CALCIUM 8.3*    Cardiac Enzymes  Recent Labs Lab 04/07/16 0053  TROPONINI 0.88*    Microbiology Results  No results found for this or any previous visit.  RADIOLOGY:  Dg Chest 2 View  Result Date: 04/06/2016 CLINICAL DATA:  Shortness breath and back pain started Saturday. EXAM: CHEST  2 VIEW COMPARISON:  None. FINDINGS: The heart size and mediastinal contours are within normal limits. Both lungs are clear. The visualized skeletal structures are unremarkable. IMPRESSION: No active cardiopulmonary disease. Electronically Signed   By: Kennith CenterEric  Mansell M.D.   On: 04/06/2016 14:01     Management plans discussed with the patient, family and they are in agreement.  CODE STATUS:     Code Status Orders        Start     Ordered   04/06/16 1633  Full code  Continuous     04/06/16 1634    Code Status History    Date Active Date Inactive Code Status Order ID Comments User Context   This patient has a current code status but no historical code status.      TOTAL TIME TAKING CARE OF THIS PATIENT: 33 minutes.    Joyel Chenette,  Mardi MainlandDavid K M.D on 04/07/2016 at 10:48 AM  Between 7am to 6pm - Pager - [phone]  After 6pm go to [email or web address] - Scientist, research (life sciences)password [email or web address]  [facility] [place] [facility]  Office  [phone]  CC: Primary care physician; No PCP Per Patient

## 2016-04-07 NOTE — Progress Notes (Signed)
ANTICOAGULATION CONSULT NOTE - Initial Consult  Pharmacy Consult for Heparin  Indication: chest pain/ACS  Allergies  Allergen Reactions  . Triaminic Fever Reducer [Acetaminophen] Other (See Comments)    unknown    Patient Measurements: Height: 6' (182.9 cm) Weight: 186 lb 12.8 oz (84.7 kg) IBW/kg (Calculated) : 77.6 Heparin Dosing Weight: 88 kg   Vital Signs: BP: 94/57 (08/30 0536) Pulse Rate: 84 (08/30 0536)  Labs:  Recent Labs  04/06/16 1331 04/06/16 1911 04/06/16 2254 04/07/16 0053 04/07/16 0653  HGB 16.3  --   --   --  15.9  HCT 47.8  --   --   --  45.7  PLT 265  --   --   --  230  APTT 35  --   --   --   --   LABPROT  --  13.6  --   --   --   INR  --  1.04  --   --   --   HEPARINUNFRC  --   --  0.16*  --  0.41  CREATININE 0.95  --   --   --  0.80  CKTOTAL 126  --   --   --   --   CKMB 5.4*  --   --   --   --   TROPONINI 1.39* 1.58*  --  0.88*  --     Estimated Creatinine Clearance: 145.5 mL/min (by C-G formula based on SCr of 0.8 mg/dL).   Medical History: History reviewed. No pertinent past medical history.  Medications:  Scheduled:  . aspirin EC  81 mg Oral Daily  . atorvastatin  40 mg Oral q1800  . sodium chloride  1,000 mL Intravenous Once  . sodium chloride flush  3 mL Intravenous Q12H  . sodium chloride flush  3 mL Intravenous Q12H    Assessment: Pharmacy consulted to dose heparin in this 35 year old male admitted with chest pain.  No prior anticoag noted.  CrCl = 122.5 ml/min  Goal of Therapy:  Heparin level 0.3-0.7 units/ml Monitor platelets by anticoagulation protocol: Yes   Plan:  Current orders for heparin 1450 units/hr. Heparin level therapeutic x 1. Will check confirmatory level in 6 hours, CBC tomorrow AM.  Garlon HatchetBarefoot,Kanaya Gunnarson C, Pharm.D. Clinical Pharmacist 04/07/2016,8:08 AM

## 2016-04-08 LAB — URINE CULTURE: Culture: NO GROWTH

## 2017-03-15 IMAGING — CR DG CHEST 2V
2 series · 2 of 2 positions shown · non-contrast
Comparison: None.

CLINICAL DATA: Shortness breath and back pain started [REDACTED].

EXAM:
CHEST  2 VIEW

[chest pa]
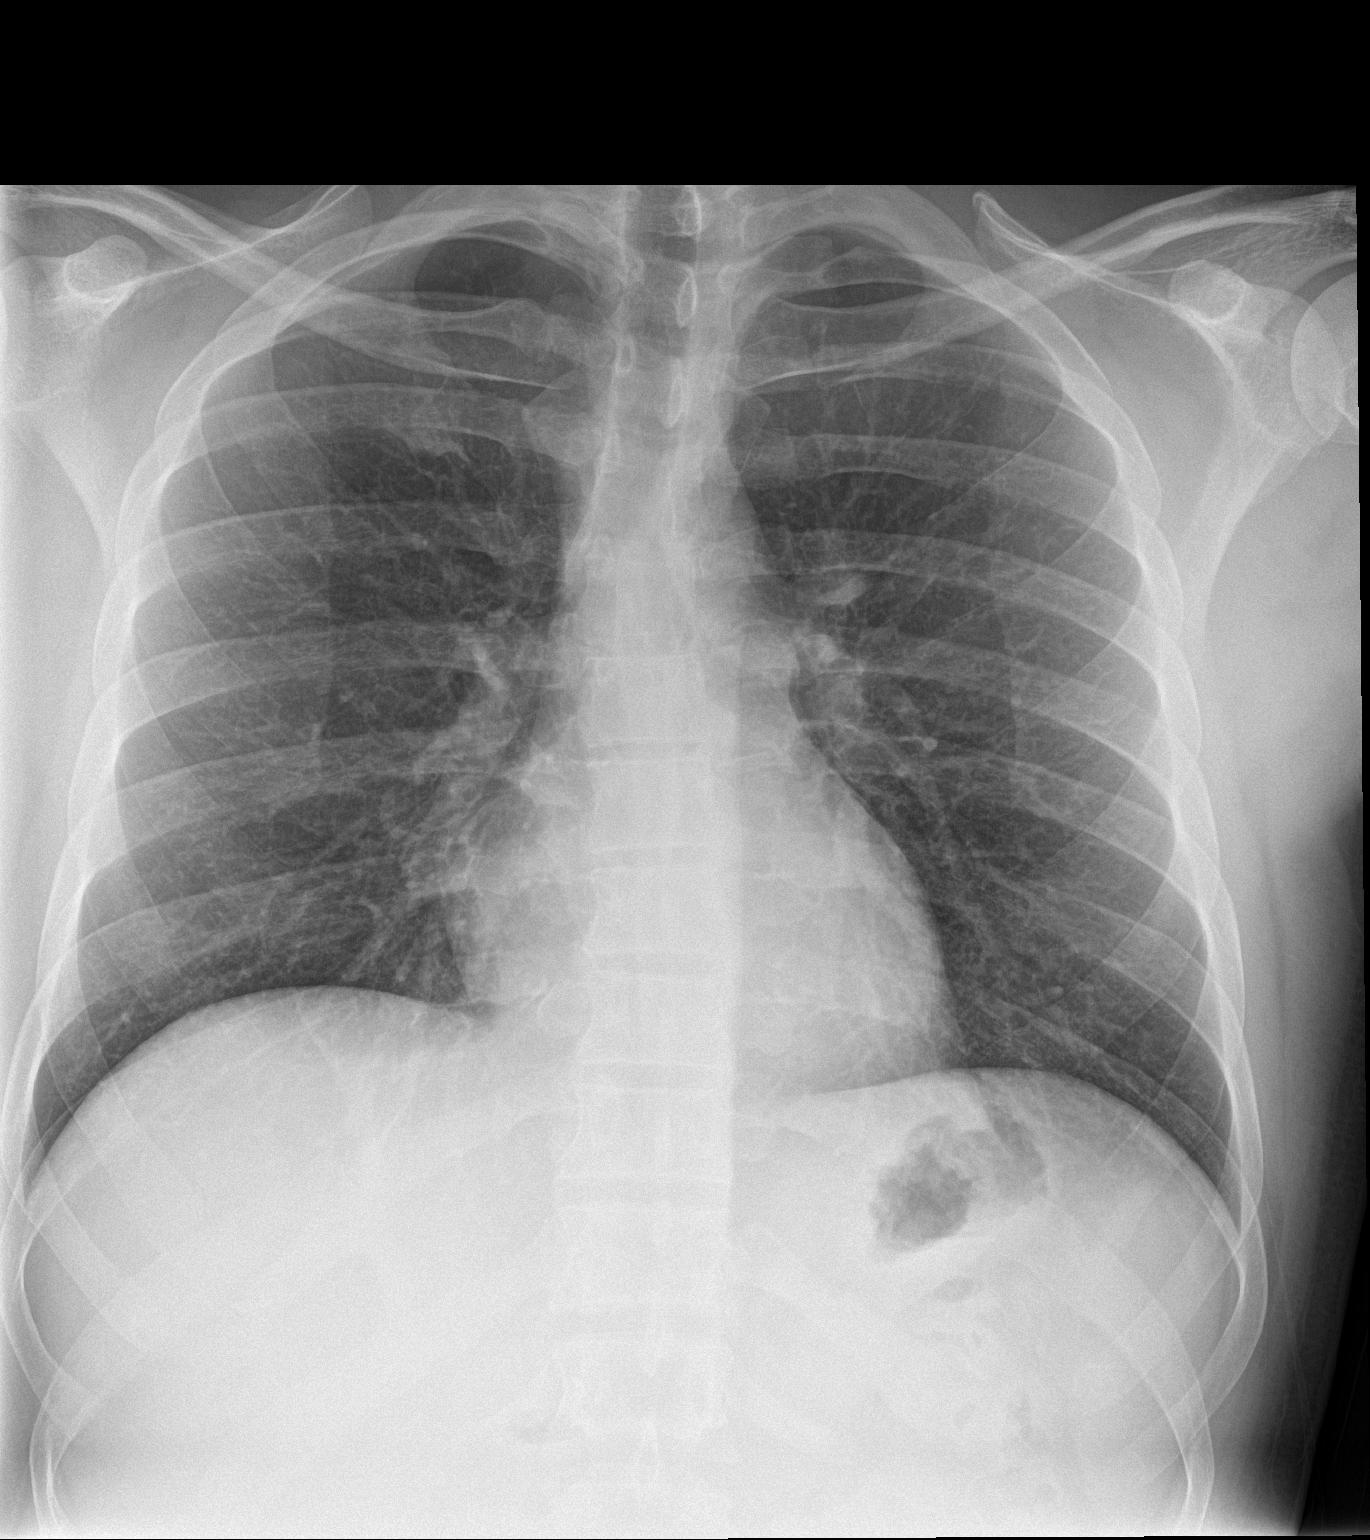

[chest lat]
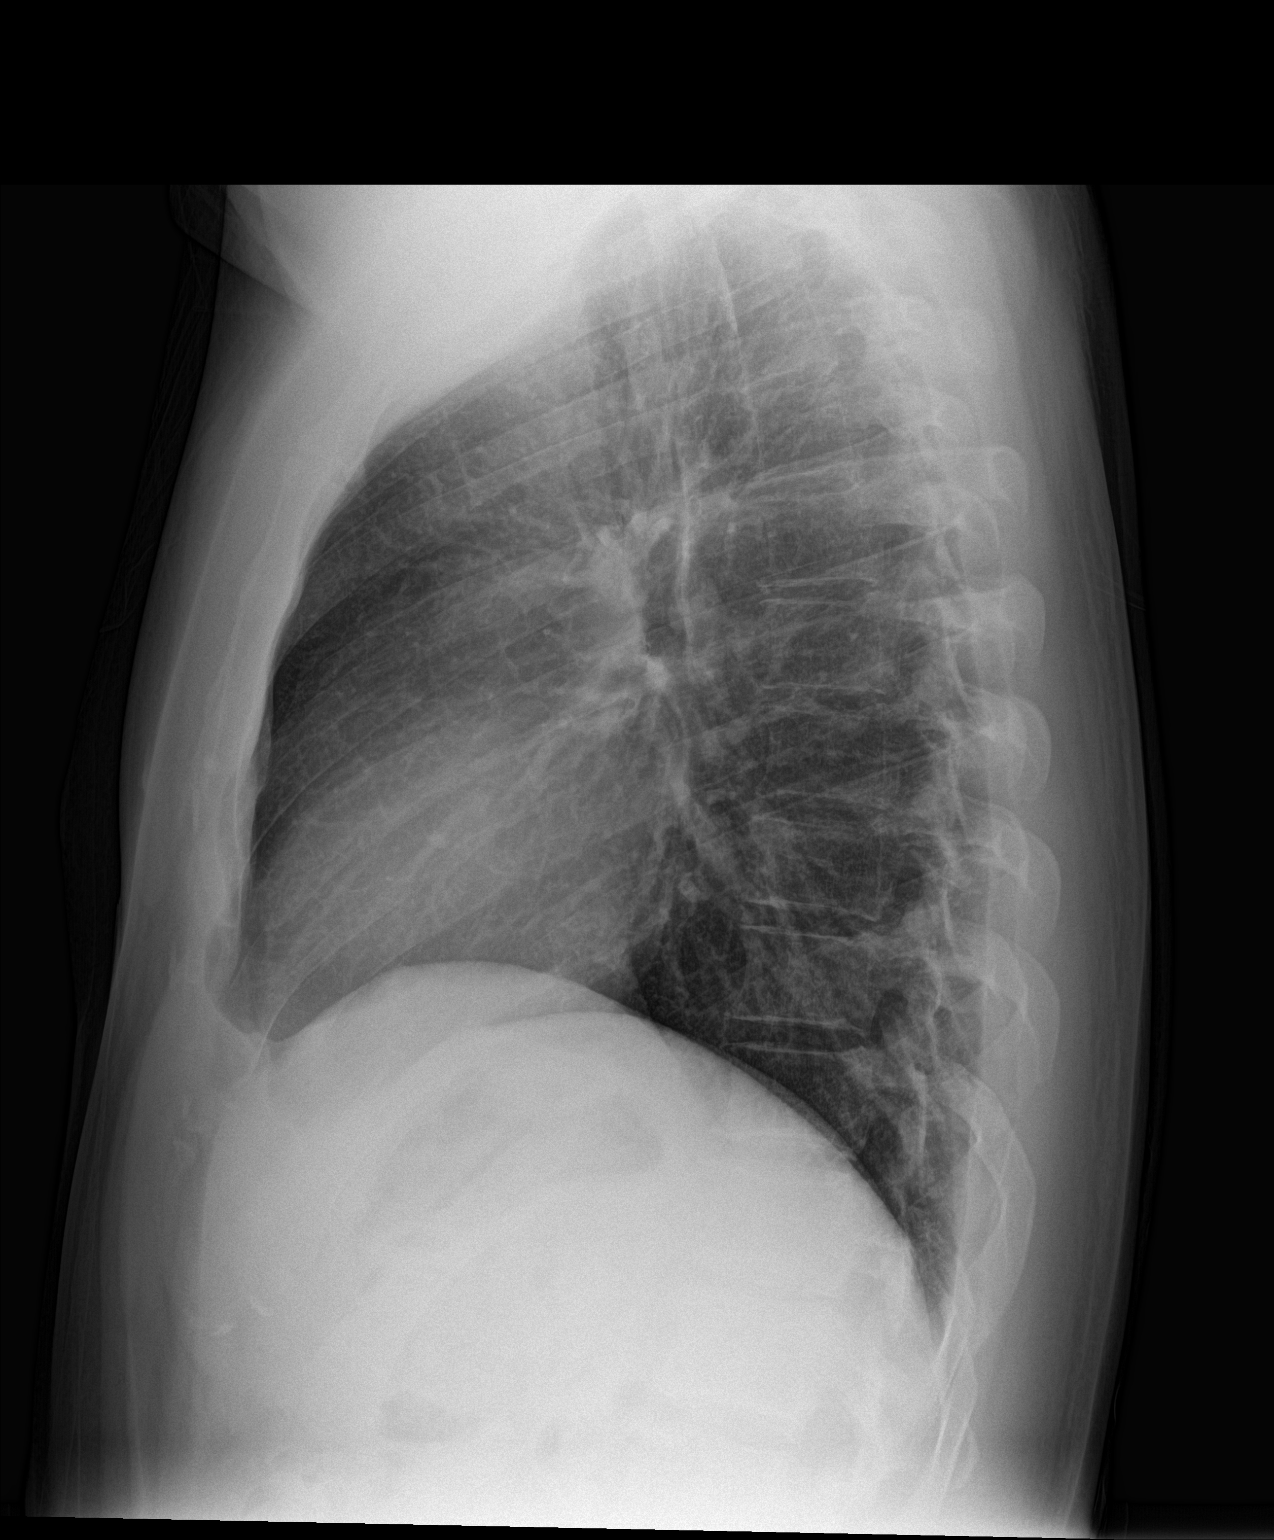

[2 of 2 positions shown; findings below may reference images not displayed]

FINDINGS: The heart size and mediastinal contours are within normal limits.
Both lungs are clear. The visualized skeletal structures are
unremarkable.
IMPRESSION: No active cardiopulmonary disease.

## 2019-09-25 ENCOUNTER — Ambulatory Visit
Admission: EM | Admit: 2019-09-25 | Discharge: 2019-09-25 | Disposition: A | Discharge status: Home / Self Care | Payer: Self-pay

## 2019-09-25 ENCOUNTER — Other Ambulatory Visit: Payer: Self-pay

## 2019-09-25 DIAGNOSIS — Z0289 Encounter for other administrative examinations: Secondary | ICD-10-CM

## 2019-09-25 DIAGNOSIS — Z7689 Persons encountering health services in other specified circumstances: Secondary | ICD-10-CM

## 2019-09-25 NOTE — ED Triage Notes (Signed)
Pt states had diarrhea and weakness last Tuesday and Wednesday. States his work told him to stay home for 10 days and needs to see a provider prior to returning to work and needs a work note. Pt denies any problems at this time.

## 2019-09-25 NOTE — ED Provider Notes (Signed)
[facility]    CSN: [id number] Arrival date & time: 09/25/19  1059      History   Chief Complaint Chief Complaint  Patient presents with  . work note    HPI Leonard Barrett is a 39 y.o. male.   39 year old male comes in for return to work note. He had diarrhea 7 days ago that lasted for 2 days. Had some weakness from diarrhea then. He has been asymptomatic for the past 6 days, but work wanted him to be quarantined for 10 days, and needed work note to return. He denies having any fever, chills, body aches during. Denies URI symptoms. Denies abdominal pain, nausea, vomiting. At the time, did have decrease in appetite. Denies shortness of breath, loss of taste/smell. Has return to normal diet since symptoms resolved.      History reviewed. No pertinent past medical history.  Patient Active Problem List   Diagnosis Date Noted  . NSTEMI (non-ST elevated myocardial infarction) (HCC) 04/06/2016    Past Surgical History:  Procedure Laterality Date  . PENECTOMY         Home Medications    Prior to Admission medications   Not on File    Family History Family History  Problem Relation Age of Onset  . CAD Neg Hx     Social History Social History   Tobacco Use  . Smoking status: Current Every Day Smoker    Packs/day: 0.50  . Smokeless tobacco: Never Used  Substance Use Topics  . Alcohol use: Yes    Comment: rarely  . Drug use: Not Currently     Allergies   Triaminic fever reducer [acetaminophen]   Review of Systems Review of Systems  Reason unable to perform ROS: See HPI as above.     Physical Exam Triage Vital Signs ED Triage Vitals  Enc Vitals Group     BP 09/25/19 1121 129/72     Pulse Rate 09/25/19 1121 70     Resp 09/25/19 1121 16     Temp 09/25/19 1121 98.5 F (36.9 C)     Temp Source 09/25/19 1121 Oral     SpO2 09/25/19 1121 96 %     Weight --      Height --      Head Circumference --      Peak Flow --      Pain Score  09/25/19 1122 0     Pain Loc --      Pain Edu? --      Excl. in GC? --    No data found.  Updated Vital Signs BP 129/72 (BP Location: Left Arm)   Pulse 70   Temp 98.5 F (36.9 C) (Oral)   Resp 16   SpO2 96%   Visual Acuity Right Eye Distance:   Left Eye Distance:   Bilateral Distance:    Right Eye Near:   Left Eye Near:    Bilateral Near:     Physical Exam Constitutional:      General: He is not in acute distress.    Appearance: Normal appearance. He is well-developed. He is not toxic-appearing or diaphoretic.  HENT:     Head: Normocephalic and atraumatic.  Eyes:     Conjunctiva/sclera: Conjunctivae normal.     Pupils: Pupils are equal, round, and reactive to light.  Cardiovascular:     Rate and Rhythm: Normal rate and regular rhythm.  Pulmonary:     Effort: Pulmonary effort is normal. No respiratory  distress.     Comments: Speaking in full sentences without difficulty. LCTAB Musculoskeletal:     Cervical back: Normal range of motion and neck supple.  Skin:    General: Skin is warm and dry.  Neurological:     Mental Status: He is alert and oriented to person, place, and time.      UC Treatments / Results  Labs (all labs ordered are listed, but only abnormal results are displayed) Labs Reviewed - No data to display  EKG   Radiology No results found.  Procedures Procedures (including critical care time)  Medications Ordered in UC Medications - No data to display  Initial Impression / Assessment and Plan / UC Course  I have reviewed the triage vital signs and the nursing notes.  Pertinent labs & imaging results that were available during my care of the patient were reviewed by me and considered in my medical decision making (see chart for details).    Patient has been asymptomatic for 6 days. Only had diarrhea during, low suspicion for COVID. Can return to work after finishing his 10 day quarantine per work unless develop new symptoms. Continue to  monitor. Patient expresses understanding and agrees to plan.  Final Clinical Impressions(s) / UC Diagnoses   Final diagnoses:  Return to work exam   ED Prescriptions    None     PDMP not reviewed this encounter.   Ok Edwards, PA-C 09/25/19 1223

## 2019-09-25 NOTE — Discharge Instructions (Signed)
Symptoms resolved, with no significant COVID like symptoms. OK to return to work. Will need reevaluation if develop new symptoms.

## 2020-10-25 ENCOUNTER — Encounter: Payer: Self-pay | Admitting: Emergency Medicine

## 2020-10-25 ENCOUNTER — Emergency Department (INDEPENDENT_AMBULATORY_CARE_PROVIDER_SITE_OTHER)
Admission: EM | Admit: 2020-10-25 | Discharge: 2020-10-25 | Disposition: A | Discharge status: Home / Self Care | Payer: Managed Care, Other (non HMO) | Source: Home / Self Care

## 2020-10-25 ENCOUNTER — Other Ambulatory Visit: Payer: Self-pay

## 2020-10-25 DIAGNOSIS — N342 Other urethritis: Secondary | ICD-10-CM

## 2020-10-25 DIAGNOSIS — Z202 Contact with and (suspected) exposure to infections with a predominantly sexual mode of transmission: Secondary | ICD-10-CM | POA: Diagnosis not present

## 2020-10-25 MED ORDER — DOXYCYCLINE HYCLATE 100 MG PO CAPS: 100.0000 mg | ORAL_CAPSULE | Freq: Two times a day (BID) | ORAL | 0 refills | Status: DC

## 2020-10-25 NOTE — Discharge Instructions (Addendum)
I have sent in doxycycline for you to take one tablet twice a day for 10 days  We will call you with any abnormal results that require further treatment  Do no have sex until results are back and negative or until treatment is completed, whichever is longer.  Follow up with this office or with primary care if symptoms are persisting.  Follow up in the ER for high fever, trouble swallowing, trouble breathing, other concerning symptoms.

## 2020-10-25 NOTE — ED Provider Notes (Signed)
[facility]    CSN: [id number] Arrival date & time: 10/25/20  1240      History   Chief Complaint Chief Complaint  Patient presents with  . Exposure to STD    HPI Leonard Barrett is a 40 y.o. male.   Reports that recent partner tested positive and was treated for chlamydia. Reports an "itch" in his urethra. States that at the end of the day he may have just a little penile drainage, vague about appearance. Denies dysuria, frequency, urgency, change in color or smell of urine, fever, chills, rash, other symptoms.  ROS per HPI  The history is provided by the patient.  Exposure to STD    History reviewed. No pertinent past medical history.  Patient Active Problem List   Diagnosis Date Noted  . NSTEMI (non-ST elevated myocardial infarction) (HCC) 04/06/2016    Past Surgical History:  Procedure Laterality Date  . PENECTOMY         Home Medications    Prior to Admission medications   Medication Sig Start Date End Date Taking? Authorizing Provider  doxycycline (VIBRAMYCIN) 100 MG capsule Take 1 capsule (100 mg total) by mouth 2 (two) times daily. 10/25/20  Yes Moshe Cipro, NP    Family History Family History  Problem Relation Age of Onset  . CAD Neg Hx     Social History Social History   Tobacco Use  . Smoking status: Current Every Day Smoker    Packs/day: 0.50  . Smokeless tobacco: Never Used  Substance Use Topics  . Alcohol use: Yes    Comment: rarely  . Drug use: Not Currently     Allergies   Other and Triaminic fever reducer [acetaminophen]   Review of Systems Review of Systems   Physical Exam Triage Vital Signs ED Triage Vitals  Enc Vitals Group     BP 10/25/20 1324 127/83     Pulse Rate 10/25/20 1324 61     Resp 10/25/20 1324 16     Temp 10/25/20 1324 98 F (36.7 C)     Temp Source 10/25/20 1324 Oral     SpO2 10/25/20 1324 98 %     Weight 10/25/20 1329 210 lb (95.3 kg)     Height 10/25/20 1329 6' (1.829 m)      Head Circumference --      Peak Flow --      Pain Score 10/25/20 1328 0     Pain Loc --      Pain Edu? --      Excl. in GC? --    No data found.  Updated Vital Signs BP 127/83 (BP Location: Right Arm)   Pulse 61   Temp 98 F (36.7 C) (Oral)   Resp 16   Ht 6' (1.829 m)   Wt 210 lb (95.3 kg)   SpO2 98%   BMI 28.48 kg/m   Visual Acuity Right Eye Distance:   Left Eye Distance:   Bilateral Distance:    Right Eye Near:   Left Eye Near:    Bilateral Near:     Physical Exam Vitals and nursing note reviewed.  Constitutional:      General: He is not in acute distress.    Appearance: Normal appearance. He is well-developed. He is not ill-appearing.  HENT:     Head: Normocephalic and atraumatic.     Nose: Nose normal.  Eyes:     Extraocular Movements: Extraocular movements intact.     Conjunctiva/sclera: Conjunctivae normal.  Pupils: Pupils are equal, round, and reactive to light.  Cardiovascular:     Rate and Rhythm: Normal rate and regular rhythm.     Heart sounds: No murmur heard.   Pulmonary:     Effort: Pulmonary effort is normal. No respiratory distress.  Abdominal:     Palpations: Abdomen is soft.     Tenderness: There is no abdominal tenderness.  Genitourinary:    Comments: Deferred  Musculoskeletal:        General: Normal range of motion.     Cervical back: Neck supple.  Skin:    General: Skin is warm and dry.     Capillary Refill: Capillary refill takes less than 2 seconds.  Neurological:     General: No focal deficit present.     Mental Status: He is alert and oriented to person, place, and time.  Psychiatric:        Mood and Affect: Mood normal.        Behavior: Behavior normal.        Thought Content: Thought content normal.      UC Treatments / Results  Labs (all labs ordered are listed, but only abnormal results are displayed) Labs Reviewed  C. TRACHOMATIS/N. GONORRHOEAE RNA    EKG   Radiology No results  found.  Procedures Procedures (including critical care time)  Medications Ordered in UC Medications - No data to display  Initial Impression / Assessment and Plan / UC Course  I have reviewed the triage vital signs and the nursing notes.  Pertinent labs & imaging results that were available during my care of the patient were reviewed by me and considered in my medical decision making (see chart for details).    STD exposure Urethritis  Urine obtained for GC/chlamydia, trichomonas testing Prescribed doxycycline 100mg  BID x 7 days for known chlamydia exposure We will be in contact with any further abnormal labs that require further treatment Abstain from sex until results are back and negative or until treatment is completed, whichever is longer Follow up with this office or with primary care if symptoms are persisting.  Follow up in the ER for high fever, trouble swallowing, trouble breathing, other concerning symptoms.   Final Clinical Impressions(s) / UC Diagnoses   Final diagnoses:  STD exposure  Urethritis     Discharge Instructions     I have sent in doxycycline for you to take one tablet twice a day for 10 days  We will call you with any abnormal results that require further treatment  Do no have sex until results are back and negative or until treatment is completed, whichever is longer.  Follow up with this office or with primary care if symptoms are persisting.  Follow up in the ER for high fever, trouble swallowing, trouble breathing, other concerning symptoms.     ED Prescriptions    Medication Sig Dispense Auth. Provider   doxycycline (VIBRAMYCIN) 100 MG capsule Take 1 capsule (100 mg total) by mouth 2 (two) times daily. 14 capsule , NP     PDMP not reviewed this encounter.   Moshe Cipro, NP 10/25/20 1418

## 2020-10-25 NOTE — ED Triage Notes (Signed)
Patient here because partner tested positive for chlamydia last week; vague about mild symptoms. States was treated for assumed STD about 5 years ago and received 'shots' and developed myocarditis with hospitalization.

## 2020-10-28 LAB — C. TRACHOMATIS/N. GONORRHOEAE RNA
C. trachomatis RNA, TMA: DETECTED — AB
N. gonorrhoeae RNA, TMA: NOT DETECTED

## 2020-11-29 NOTE — Progress Notes (Signed)
Subjective:    Leonard Barrett - 40 y.o. male MRN [id number]  Date of birth: 1981/04/06  HPI  Leonard Barrett is to establish care. Patient has a PMH significant for non-ST elevated myocardial infarction.   Current issues and/or concerns: Initially positive on 10/25/2020 he did receive and complete treatment. Currently not having any symptoms. Requesting repeat testing for chlamydia.    ROS per HPI   Health Maintenance:  Health Maintenance Due  Topic Date Due  . Hepatitis C Screening  Never done  . COVID-19 Vaccine (1) Never done  . HIV Screening  Never done  . TETANUS/TDAP  Never done    Past Medical History: Patient Active Problem List   Diagnosis Date Noted  . NSTEMI (non-ST elevated myocardial infarction) (HCC) 04/06/2016    Social History   reports that he has been smoking cigarettes. He has been smoking about 0.50 packs per day. He has never used smokeless tobacco. He reports current alcohol use of about 2.0 standard drinks of alcohol per week. He reports previous drug use.   Family History  family history includes Crohn's disease in his mother; Diabetes in his paternal grandfather; Lung disease in his father.   Medications: reviewed and updated   Objective:   Physical Exam BP 134/79 (BP Location: Left Arm, Patient Position: Sitting)   Pulse 68   Ht 6' 0.13" (1.832 m)   Wt 202 lb 6.4 oz (91.8 kg)   SpO2 97%   BMI 27.35 kg/m  Physical Exam HENT:     Head: Normocephalic and atraumatic.  Eyes:     Extraocular Movements: Extraocular movements intact.     Conjunctiva/sclera: Conjunctivae normal.     Pupils: Pupils are equal, round, and reactive to light.  Cardiovascular:     Rate and Rhythm: Normal rate and regular rhythm.     Pulses: Normal pulses.     Heart sounds: Normal heart sounds.  Pulmonary:     Effort: Pulmonary effort is normal.     Breath sounds: Normal breath sounds.  Musculoskeletal:     Cervical back: Normal range of motion and neck supple.   Neurological:     General: No focal deficit present.     Mental Status: He is alert and oriented to person, place, and time.  Psychiatric:        Mood and Affect: Mood normal.        Behavior: Behavior normal.         Assessment & Plan:  1. Encounter to establish care: - Patient presents today to establish care.  - Return for annual physical examination, labs, and health maintenance. Arrive fasting meaning having no for at least 8 hours prior to appointment. You may have only water or black coffee. Please take scheduled medications as normal.  2. Routine screening for STI (sexually transmitted infection): - Urine cytology to screen for gonorrhea, chlamydia, and trichomonas.  - Urine cytology ancillary only    Patient was given clear instructions to go to Emergency Department or return to medical center if symptoms don't improve, worsen, or new problems develop.The patient verbalized understanding.  I discussed the assessment and treatment plan with the patient. The patient was provided an opportunity to ask questions and all were answered. The patient agreed with the plan and demonstrated an understanding of the instructions.   The patient was advised to call back or seek an in-person evaluation if the symptoms worsen or if the condition fails to improve as anticipated.    Cavan Bearden Zonia Kief,  NP 12/02/2020, 11:26 AM Primary Care at [facility]

## 2020-12-01 ENCOUNTER — Other Ambulatory Visit (HOSPITAL_COMMUNITY)
Admission: RE | Admit: 2020-12-01 | Discharge: 2020-12-01 | Disposition: A | Discharge status: Home / Self Care | Payer: Managed Care, Other (non HMO) | Source: Ambulatory Visit | Attending: Family | Admitting: Family

## 2020-12-01 ENCOUNTER — Ambulatory Visit (INDEPENDENT_AMBULATORY_CARE_PROVIDER_SITE_OTHER): Payer: Managed Care, Other (non HMO) | Admitting: Family

## 2020-12-01 ENCOUNTER — Encounter: Payer: Self-pay | Admitting: Family

## 2020-12-01 VITALS — BP 134/79 | HR 68 | Ht 72.13 in | Wt 202.4 lb

## 2020-12-01 DIAGNOSIS — Z7689 Persons encountering health services in other specified circumstances: Secondary | ICD-10-CM

## 2020-12-01 DIAGNOSIS — Z113 Encounter for screening for infections with a predominantly sexual mode of transmission: Secondary | ICD-10-CM | POA: Insufficient documentation

## 2020-12-01 NOTE — Patient Instructions (Addendum)
Return for annual physical examination, labs, and health maintenance. Arrive fasting meaning having no for at least 8 hours prior to appointment. You may have only water or black coffee. Please take scheduled medications as normal. Thank you for choosing Primary Care at [facility] for your medical home!    Leonard Barrett was seen by Rema Fendt, NP today.   Leonard Barrett's primary care provider is Luc Shammas Jodi Geralds, NP.   For the best care possible,  you should try to see Ricky Stabs, NP whenever you come to clinic.   We look forward to seeing you again soon!  If you have any questions about your visit today,  please call us at [phone]  Or feel free to reach your provider via MyChart.    Chlamydia, Male Chlamydia is an STI (sexually transmitted infection) that is caused by bacteria. This infection spreads through sexual contact. Chlamydia can occur in different areas of the body, including:  The urethra. This is the part of the body that drains urine from the bladder and through the penis.  The throat.  The rectum. This condition is not difficult to treat. However, if left untreated, chlamydia can lead to more serious health problems. What are the causes? This condition is caused by the bacteria Chlamydia trachomatis. The bacteria are spread from an infected partner during sexual activity. Chlamydia can spread through contact with the genitals, mouth, or rectum. What increases the risk? The following factors may make you more likely to develop this condition:  Not using a condom correctly or not using a condom every time you have sex.  Having a new sex partner or having more than one sex partner.  Being a man who has sex with men (MSM). What are the signs or symptoms? In some cases, there are no symptoms, especially early in the infection. Having no symptoms is also called being asymptomatic. If symptoms develop, they may include:  Urinating often, or having a burning  feeling during urination.  Pain or swelling in the testicles.  Watery, mucus-like discharge from the penis.  Redness, soreness, swelling, bleeding, or discharge from the rectum. This may occur if the infection was spread through anal sex.  Pain in the abdomen.  Itching, burning, or redness in the eyes, or discharge from the eyes.  Pain during sex. How is this diagnosed? This condition may be diagnosed with:  Urine tests.  Swab tests. Depending on your symptoms, your health care provider may use a cotton swab to collect discharge from your urethra or rectum to test for the bacteria. How is this treated? This condition is treated with antibiotic medicines. Follow these instructions at home: Sexual activity  Tell your sex partner or partners about your infection. These include any partners for oral, anal, or vaginal sex that you have had within 60 days of when your symptoms started. Sex partners should also be treated, even if they have no signs of the disease.  Do not have sex until you and your sex partners have completed treatment and your health care provider says it is okay. If your health care provider prescribed you a single-dose medicine as treatment, wait at least 7 days after taking the medicine before having sex. General instructions  Take over-the-counter and prescription medicines as told by your health care provider. Finish all antibiotic medicine even when you start to feel better.  It is up to you to get your test results. Ask your health care provider, or the department that  is doing the test, when your results will be ready.  Get plenty of rest.  Eat a healthy, well-balanced diet.  Drink enough fluids to keep your urine pale yellow.  Keep all follow-up visits as told by your health care provider. This is important. You may need to be tested for infection again 3 months after treatment. How is this prevented? The only sure way to prevent chlamydia is to avoid  having vaginal, anal, and oral sex. However, you can lower your risk by:  Using latex condoms correctly every time you have sex.  Not having multiple sexual partners.  Asking if your sex partner has been tested for STIs and had negative results.  Getting regular health screenings to check for STIs.   Contact a health care provider if:  You develop new symptoms or your symptoms do not get better after you complete treatment.  You have a fever or chills.  You have pain during sex.  You develop new joint pain or swelling near your joints.  You have pain or soreness in your testicles. Get help right away if:  Your pain gets worse and does not get better with medicine.  You have abnormal discharge.  You develop flu-like symptoms, such as night sweats, sore throat, or muscle aches. Summary  Chlamydia is an STI (sexually transmitted infection) that is caused by bacteria. This infection spreads through sexual contact.  This condition is not difficult to treat. However, if left untreated, it can lead to more serious health problems.  Some people have no symptoms (are asymptomatic), especially early in the infection.  This condition is treated with antibiotic medicines.  Using latex condoms correctly every time you have sex can help prevent chlamydia. This information is not intended to replace advice given to you by your health care provider. Make sure you discuss any questions you have with your health care provider. Document Revised: 08/13/2019 Document Reviewed: 08/13/2019 Elsevier Patient Education  2021 [employer].

## 2020-12-01 NOTE — Progress Notes (Signed)
Establish care Shoulder blade pain Urine std check for Chlamydia

## 2020-12-02 LAB — URINE CYTOLOGY ANCILLARY ONLY
Chlamydia: NEGATIVE
Comment: NEGATIVE
Comment: NEGATIVE
Comment: NORMAL
Neisseria Gonorrhea: NEGATIVE
Trichomonas: NEGATIVE

## 2020-12-02 NOTE — Progress Notes (Signed)
Gonorrhea, Chlamydia, and Trichomonas negative.

## 2021-02-03 ENCOUNTER — Ambulatory Visit (INDEPENDENT_AMBULATORY_CARE_PROVIDER_SITE_OTHER): Payer: Managed Care, Other (non HMO) | Admitting: Family

## 2021-02-03 ENCOUNTER — Other Ambulatory Visit: Payer: Self-pay

## 2021-02-03 VITALS — BP 121/73 | HR 71 | Temp 98.1°F | Resp 16 | Ht 72.13 in | Wt 199.8 lb

## 2021-02-03 DIAGNOSIS — Z1329 Encounter for screening for other suspected endocrine disorder: Secondary | ICD-10-CM

## 2021-02-03 DIAGNOSIS — Z114 Encounter for screening for human immunodeficiency virus [HIV]: Secondary | ICD-10-CM

## 2021-02-03 DIAGNOSIS — Z1159 Encounter for screening for other viral diseases: Secondary | ICD-10-CM

## 2021-02-03 DIAGNOSIS — Z131 Encounter for screening for diabetes mellitus: Secondary | ICD-10-CM

## 2021-02-03 DIAGNOSIS — Z0000 Encounter for general adult medical examination without abnormal findings: Secondary | ICD-10-CM

## 2021-02-03 DIAGNOSIS — Z130 Encounter for screening for diseases of the blood and blood-forming organs and certain disorders involving the immune mechanism: Secondary | ICD-10-CM

## 2021-02-03 DIAGNOSIS — Z13228 Encounter for screening for other metabolic disorders: Secondary | ICD-10-CM

## 2021-02-03 DIAGNOSIS — Z13220 Encounter for screening for lipoid disorders: Secondary | ICD-10-CM

## 2021-02-03 NOTE — Progress Notes (Signed)
Patient ID: Leonard Barrett, male    DOB: May 12, 1981  MRN: [id number]  CC: Annual Physical Exam  Subjective: Leonard Barrett is a 40 y.o. male who presents for annual physical exam.   His concerns today include: none   Patient Active Problem List   Diagnosis Date Noted   NSTEMI (non-ST elevated myocardial infarction) ([place]) 04/06/2016     No current outpatient medications on file prior to visit.   No current facility-administered medications on file prior to visit.    Allergies  Allergen Reactions   Other Hives    Received injections x 2 for suspected std 5 years ago and within 2 days developed myocarditis and was hospitalized. Does not know name of drug.   Triaminic Fever Reducer [Acetaminophen] Rash    Patient reports that he takes tylenol (acetaminophen) without a problem    Social History   Socioeconomic History   Marital status: Single    Spouse name: Not on file   Number of children: Not on file   Years of education: Not on file   Highest education level: Not on file  Occupational History   Not on file  Tobacco Use   Smoking status: Every Day    Packs/day: 0.50    Pack years: 0.00    Types: Cigarettes   Smokeless tobacco: Never  Vaping Use   Vaping Use: Not on file  Substance and Sexual Activity   Alcohol use: Yes    Alcohol/week: 2.0 standard drinks    Types: 2 Standard drinks or equivalent per week    Comment: rarely   Drug use: Not Currently   Sexual activity: Yes  Other Topics Concern   Not on file  Social History Narrative   Not on file   Social Determinants of Health   Financial Resource Strain: Not on file  Food Insecurity: Not on file  Transportation Needs: Not on file  Physical Activity: Not on file  Stress: Not on file  Social Connections: Not on file  Intimate Partner Violence: Not on file    Family History  Problem Relation Age of Onset   Crohn's disease Mother    Lung disease Father    Diabetes Paternal Grandfather    CAD Neg Hx      Past Surgical History:  Procedure Laterality Date   PENECTOMY      ROS: Review of Systems Negative except as stated above  PHYSICAL EXAM: BP 121/73 (BP Location: Left Arm, Patient Position: Sitting, Cuff Size: Normal)   Pulse 71   Temp 98.1 F (36.7 C)   Resp 16   Wt 199 lb 12.8 oz (90.6 kg)   SpO2 96%   BMI 27.00 kg/m   Physical Exam HENT:     Head: Normocephalic and atraumatic.     Right Ear: Tympanic membrane, ear canal and external ear normal.     Left Ear: Tympanic membrane, ear canal and external ear normal.  Eyes:     Extraocular Movements: Extraocular movements intact.     Conjunctiva/sclera: Conjunctivae normal.     Pupils: Pupils are equal, round, and reactive to light.  Cardiovascular:     Rate and Rhythm: Normal rate and regular rhythm.  Pulmonary:     Effort: Pulmonary effort is normal.     Breath sounds: Normal breath sounds.  Abdominal:     General: Bowel sounds are normal.     Palpations: Abdomen is soft.  Genitourinary:    Comments: Patient declined examination.  Musculoskeletal:  General: Normal range of motion.     Cervical back: Normal range of motion and neck supple.  Skin:    General: Skin is warm and dry.     Capillary Refill: Capillary refill takes less than 2 seconds.     Comments: Eczema palmar aspect bilateral hands, present since childhood. No longer seeing Dermatology.    Neurological:     General: No focal deficit present.     Mental Status: He is alert and oriented to person, place, and time.  Psychiatric:        Mood and Affect: Mood normal.        Behavior: Behavior normal.    ASSESSMENT AND PLAN: 1. Annual physical exam: - Counseled on 150 minutes of exercise per week as tolerated, healthy eating (including decreased daily intake of saturated fats, cholesterol, added sugars, sodium), STI prevention, and routine healthcare maintenance.  2. Screening for metabolic disorder: - CMP 95+DFGI to check kidney function,  liver function, and electrolyte balance.  - CMP14+EGFR  3. Screening for deficiency anemia: - CBC to screen for anemia. - CBC  4. Diabetes mellitus screening: - Hemoglobin A1c to screen for pre-diabetes/diabetes. - Hemoglobin A1c  5. Screening cholesterol level: - Lipid panel to screen for high cholesterol.  - Lipid panel  6. Thyroid disorder screen: - TSH to check thyroid function.  - TSH  7. Need for hepatitis C screening test: - Hepatitis C antibody to screen for hepatitis C.  - Hepatitis C Antibody  8. Encounter for screening for HIV: - HIV antibody to screen for human immunodeficiency virus.  - HIV antibody (with reflex)    Patient was given the opportunity to ask questions.  Patient verbalized understanding of the plan and was able to repeat key elements of the plan. Patient was given clear instructions to go to Emergency Department or return to medical center if symptoms don't improve, worsen, or new problems develop.The patient verbalized understanding.  Follow-up with primary provider as scheduled.   Camillia Herter, NP

## 2021-02-03 NOTE — Progress Notes (Signed)
.  Pt presents for annual physical exam  

## 2021-02-03 NOTE — Patient Instructions (Signed)
Preventive Care 38-38 Years Old, Male Preventive care refers to lifestyle choices and visits with your health care provider that can promote health and wellness. This includes: A yearly physical exam. This is also called an annual wellness visit. Regular dental and eye exams. Immunizations. Screening for certain conditions. Healthy lifestyle choices, such as: Eating a healthy diet. Getting regular exercise. Not using drugs or products that contain nicotine and tobacco. Limiting alcohol use. What can I expect for my preventive care visit? Physical exam Your health care provider will check your: Height and weight. These may be used to calculate your BMI (body mass index). BMI is a measurement that tells if you are at a healthy weight. Heart rate and blood pressure. Body temperature. Skin for abnormal spots. Counseling Your health care provider may ask you questions about your: Past medical problems. Family's medical history. Alcohol, tobacco, and drug use. Emotional well-being. Home life and relationship well-being. Sexual activity. Diet, exercise, and sleep habits. Work and work environment. Access to firearms. What immunizations do I need?  Vaccines are usually given at various ages, according to a schedule. Your health care provider will recommend vaccines for you based on your age, medicalhistory, and lifestyle or other factors, such as travel or where you work. What tests do I need? Blood tests Lipid and cholesterol levels. These may be checked every 5 years, or more often if you are over 50 years old. Hepatitis C test. Hepatitis B test. Screening Lung cancer screening. You may have this screening every year starting at age 55 if you have a 30-pack-year history of smoking and currently smoke or have quit within the past 15 years. Prostate cancer screening. Recommendations will vary depending on your family history and other risks. Genital exam to check for testicular cancer  or hernias. Colorectal cancer screening. All adults should have this screening starting at age 50 and continuing until age 75. Your health care provider may recommend screening at age 45 if you are at increased risk. You will have tests every 1-10 years, depending on your results and the type of screening test. Diabetes screening. This is done by checking your blood sugar (glucose) after you have not eaten for a while (fasting). You may have this done every 1-3 years. STD (sexually transmitted disease) testing, if you are at risk. Follow these instructions at home: Eating and drinking  Eat a diet that includes fresh fruits and vegetables, whole grains, lean protein, and low-fat dairy products. Take vitamin and mineral supplements as recommended by your health care provider. Do not drink alcohol if your health care provider tells you not to drink. If you drink alcohol: Limit how much you have to 0-2 drinks a day. Be aware of how much alcohol is in your drink. In the [place] one drink equals one 12 oz bottle of beer (355 mL), one 5 oz glass of wine (148 mL), or one 1 oz glass of hard liquor (44 mL).  Lifestyle Take daily care of your teeth and gums. Brush your teeth every morning and night with fluoride toothpaste. Floss one time each day. Stay active. Exercise for at least 30 minutes 5 or more days each week. Do not use any products that contain nicotine or tobacco, such as cigarettes, e-cigarettes, and chewing tobacco. If you need help quitting, ask your health care provider. Do not use drugs. If you are sexually active, practice safe sex. Use a condom or other form of protection to prevent STIs (sexually transmitted infections). If told by   your health care provider, take low-dose aspirin daily starting at age 50. Find healthy ways to cope with stress, such as: Meditation, yoga, or listening to music. Journaling. Talking to a trusted person. Spending time with friends and  family. Safety Always wear your seat belt while driving or riding in a vehicle. Do not drive: If you have been drinking alcohol. Do not ride with someone who has been drinking. When you are tired or distracted. While texting. Wear a helmet and other protective equipment during sports activities. If you have firearms in your house, make sure you follow all gun safety procedures. What's next? Go to your health care provider once a year for an annual wellness visit. Ask your health care provider how often you should have your eyes and teeth checked. Stay up to date on all vaccines. This information is not intended to replace advice given to you by your health care provider. Make sure you discuss any questions you have with your healthcare provider. Document Revised: 04/24/2019 Document Reviewed: 07/20/2018 Elsevier Patient Education  2022 Elsevier Inc.  

## 2021-02-04 LAB — CMP14+EGFR
ALT: 28 IU/L (ref 0–44)
AST: 22 IU/L (ref 0–40)
Albumin/Globulin Ratio: 1.7 (ref 1.2–2.2)
Albumin: 4.5 g/dL (ref 4.0–5.0)
Alkaline Phosphatase: 57 IU/L (ref 44–121)
BUN/Creatinine Ratio: 13 (ref 9–20)
BUN: 12 mg/dL (ref 6–20)
Bilirubin Total: 1.5 mg/dL — ABNORMAL HIGH (ref 0.0–1.2)
CO2: 21 mmol/L (ref 20–29)
Calcium: 9.4 mg/dL (ref 8.7–10.2)
Chloride: 104 mmol/L (ref 96–106)
Creatinine, Ser: 0.91 mg/dL (ref 0.76–1.27)
Globulin, Total: 2.7 g/dL (ref 1.5–4.5)
Glucose: 78 mg/dL (ref 65–99)
Potassium: 4.4 mmol/L (ref 3.5–5.2)
Sodium: 141 mmol/L (ref 134–144)
Total Protein: 7.2 g/dL (ref 6.0–8.5)
eGFR: 111 mL/min/{1.73_m2} (ref >59)

## 2021-02-04 LAB — CBC
Hematocrit: 52 % — ABNORMAL HIGH (ref 37.5–51.0)
Hemoglobin: 17.4 g/dL (ref 13.0–17.7)
MCH: 29.3 pg (ref 26.6–33.0)
MCHC: 33.5 g/dL (ref 31.5–35.7)
MCV: 88 fL (ref 79–97)
Platelets: 348 10*3/uL (ref 150–450)
RBC: 5.94 x10E6/uL — ABNORMAL HIGH (ref 4.14–5.80)
RDW: 13 % (ref 11.6–15.4)
WBC: 11.2 10*3/uL — ABNORMAL HIGH (ref 3.4–10.8)

## 2021-02-04 LAB — LIPID PANEL
Chol/HDL Ratio: 5.5 ratio — ABNORMAL HIGH (ref 0.0–5.0)
Cholesterol, Total: 220 mg/dL — ABNORMAL HIGH (ref 100–199)
HDL: 40 mg/dL (ref >39)
LDL Chol Calc (NIH): 155 mg/dL — ABNORMAL HIGH (ref 0–99)
Triglycerides: 135 mg/dL (ref 0–149)
VLDL Cholesterol Cal: 25 mg/dL (ref 5–40)

## 2021-02-04 LAB — TSH: TSH: 1.31 u[IU]/mL (ref 0.450–4.500)

## 2021-02-04 LAB — HEPATITIS C ANTIBODY: Hep C Virus Ab: <0.1 {s_co_ratio} (ref 0.0–0.9)

## 2021-02-04 LAB — HIV ANTIBODY (ROUTINE TESTING W REFLEX): HIV Screen 4th Generation wRfx: NONREACTIVE

## 2021-02-04 LAB — HEMOGLOBIN A1C
Est. average glucose Bld gHb Est-mCnc: 100 mg/dL
Hgb A1c MFr Bld: 5.1 % (ref 4.8–5.6)

## 2021-02-04 NOTE — Progress Notes (Signed)
Please call patient with update.   Kidney function normal.   Liver function normal.   Thyroid function normal.   No diabetes.   No anemia.   Hepatitis C negative.   HIV negative.   White blood cells, sometimes called "infection fighters" mildly elevated. Encouraged to recheck in 4 to 6 weeks.   Cholesterol higher than expected. High cholesterol may increase risk of heart attack and/or stroke. Consider eating more fruits, vegetables, and lean baked meats such as chicken or fish. Moderate intensity exercise at least 150 minutes as tolerated per week may help as well. However your risk of heart attack/stroke in ten years is around average so does not need to start a medication at the moment. Encouraged to recheck in 3 to 6 months.

## 2021-04-07 ENCOUNTER — Other Ambulatory Visit: Payer: Managed Care, Other (non HMO)

## 2021-04-07 ENCOUNTER — Other Ambulatory Visit: Payer: Self-pay

## 2021-04-07 DIAGNOSIS — D72828 Other elevated white blood cell count: Secondary | ICD-10-CM

## 2021-04-08 ENCOUNTER — Other Ambulatory Visit: Payer: Self-pay | Admitting: Family

## 2021-04-08 ENCOUNTER — Telehealth: Payer: Self-pay | Admitting: Physician Assistant

## 2021-04-08 DIAGNOSIS — R718 Other abnormality of red blood cells: Secondary | ICD-10-CM

## 2021-04-08 DIAGNOSIS — D72829 Elevated white blood cell count, unspecified: Secondary | ICD-10-CM

## 2021-04-08 LAB — CBC
Hematocrit: 51.2 % — ABNORMAL HIGH (ref 37.5–51.0)
Hemoglobin: 17.5 g/dL (ref 13.0–17.7)
MCH: 29.5 pg (ref 26.6–33.0)
MCHC: 34.2 g/dL (ref 31.5–35.7)
MCV: 86 fL (ref 79–97)
Platelets: 327 10*3/uL (ref 150–450)
RBC: 5.94 x10E6/uL — ABNORMAL HIGH (ref 4.14–5.80)
RDW: 12.5 % (ref 11.6–15.4)
WBC: 12 10*3/uL — ABNORMAL HIGH (ref 3.4–10.8)

## 2021-04-08 NOTE — Telephone Encounter (Signed)
Scheduled appt per 8/31 referral. Pt aware of appt date and time.

## 2021-04-08 NOTE — Progress Notes (Signed)
White blood cells increased since 8 weeks ago. Upon review, patient has history of elevated white blood cells since at least 5 years ago. Referral to Hematology for further evaluation and management. Their office should call patient within 2 weeks with appointment details. Please note patient may receive a call from [facility]) as this is where the Hematology office is located.

## 2021-04-08 NOTE — Progress Notes (Signed)
Please call patient with update.   White blood cells increased since 8 weeks ago. Upon review, patient has history of elevated white blood cells since at least 5 years ago. Referral to Hematology for further evaluation and management. Their office should call patient within 2 weeks with appointment details. Please note patient may receive a call from [facility]) as this is where the Hematology office is located.

## 2021-04-22 ENCOUNTER — Inpatient Hospital Stay: Payer: Managed Care, Other (non HMO)

## 2021-04-22 ENCOUNTER — Inpatient Hospital Stay: Payer: Managed Care, Other (non HMO) | Attending: Physician Assistant | Admitting: Physician Assistant

## 2021-04-22 ENCOUNTER — Other Ambulatory Visit: Payer: Self-pay

## 2021-04-22 ENCOUNTER — Encounter: Payer: Self-pay | Admitting: Physician Assistant

## 2021-04-22 VITALS — BP 133/84 | HR 74 | Temp 98.2°F | Resp 17 | Ht 72.13 in | Wt 205.6 lb

## 2021-04-22 DIAGNOSIS — F17210 Nicotine dependence, cigarettes, uncomplicated: Secondary | ICD-10-CM | POA: Insufficient documentation

## 2021-04-22 DIAGNOSIS — Z9079 Acquired absence of other genital organ(s): Secondary | ICD-10-CM

## 2021-04-22 DIAGNOSIS — D72829 Elevated white blood cell count, unspecified: Secondary | ICD-10-CM

## 2021-04-22 DIAGNOSIS — D751 Secondary polycythemia: Secondary | ICD-10-CM | POA: Diagnosis not present

## 2021-04-22 LAB — CBC WITH DIFFERENTIAL (CANCER CENTER ONLY)
Abs Immature Granulocytes: 0.13 10*3/uL — ABNORMAL HIGH (ref 0.00–0.07)
Basophils Absolute: 0.1 10*3/uL (ref 0.0–0.1)
Basophils Relative: 1 %
Eosinophils Absolute: 0.1 10*3/uL (ref 0.0–0.5)
Eosinophils Relative: 1 %
HCT: 49.1 % (ref 39.0–52.0)
Hemoglobin: 17 g/dL (ref 13.0–17.0)
Immature Granulocytes: 1 %
Lymphocytes Relative: 24 %
Lymphs Abs: 2.8 10*3/uL (ref 0.7–4.0)
MCH: 29.5 pg (ref 26.0–34.0)
MCHC: 34.6 g/dL (ref 30.0–36.0)
MCV: 85.1 fL (ref 80.0–100.0)
Monocytes Absolute: 0.8 10*3/uL (ref 0.1–1.0)
Monocytes Relative: 7 %
Neutro Abs: 7.7 10*3/uL (ref 1.7–7.7)
Neutrophils Relative %: 66 %
Platelet Count: 318 10*3/uL (ref 150–400)
RBC: 5.77 MIL/uL (ref 4.22–5.81)
RDW: 12.5 % (ref 11.5–15.5)
WBC Count: 11.5 10*3/uL — ABNORMAL HIGH (ref 4.0–10.5)
nRBC: 0 % (ref 0.0–0.2)

## 2021-04-22 LAB — CMP (CANCER CENTER ONLY)
ALT: 23 U/L (ref 0–44)
AST: 19 U/L (ref 15–41)
Albumin: 4.2 g/dL (ref 3.5–5.0)
Alkaline Phosphatase: 50 U/L (ref 38–126)
Anion gap: 10 (ref 5–15)
BUN: 12 mg/dL (ref 6–20)
CO2: 23 mmol/L (ref 22–32)
Calcium: 9.4 mg/dL (ref 8.9–10.3)
Chloride: 107 mmol/L (ref 98–111)
Creatinine: 0.87 mg/dL (ref 0.61–1.24)
GFR, Estimated: >60 mL/min (ref >60)
Glucose, Bld: 89 mg/dL (ref 70–99)
Potassium: 4.1 mmol/L (ref 3.5–5.1)
Sodium: 140 mmol/L (ref 135–145)
Total Bilirubin: 2.1 mg/dL — ABNORMAL HIGH (ref 0.3–1.2)
Total Protein: 7.4 g/dL (ref 6.5–8.1)

## 2021-04-22 LAB — SAVE SMEAR(SSMR), FOR PROVIDER SLIDE REVIEW

## 2021-04-22 LAB — SEDIMENTATION RATE: Sed Rate: 0 mm/hr (ref 0–16)

## 2021-04-22 LAB — C-REACTIVE PROTEIN: CRP: 0.7 mg/dL (ref <1.0)

## 2021-04-22 NOTE — Progress Notes (Signed)
[facility] [phone]   [phone]  INITIAL CONSULT NOTE  Patient Care Team: Patient, No Pcp Per (Inactive) as PCP - General (General Practice)  Hematological/Oncological History 1) Prior Labs: -09/23/2015: WBC 12.4 (H), Hgb 16.7, Plt 319 -04/06/2016: WBC 14.8 (H), Hgb 16.3, Plt 265 -02/03/2021: WBC 11.2 (H), RBC 5.94 (H), Hgb 17.4, Hct 52.0 (H), Plt 348 -04/07/2021: WBC 12.0 (H), RBC 5.94 (H), Hgb 17.5, Hct 51.2 (H), Plt 327  2) 04/22/2021: Establish care with Georga Kaufmann PA-C  CHIEF COMPLAINTS/PURPOSE OF CONSULTATION:  "Leukocytosis and Erythrocytosis "  HISTORY OF PRESENTING ILLNESS:  Leonard Barrett 40 y.o. male with medical history significant history of tobacco use. He is unaccompanied for this visit.   On exam today, Mr. Zorn reports no changes to his energy levels or appetite.  He is able to complete all his daily activities independently.  He denies any nausea, vomiting or abdominal pain.  His bowel movements are regular without any diarrhea or constipation.  He denies any easy bruising or signs of bleeding.  He denies any fevers, chills, night sweats, shortness of breath, chest pain, cough, rash dysuria.  He has no other complaints.  Rest of the 10 point ROS is below.  MEDICAL HISTORY:  Past Medical History:  Diagnosis Date   No pertinent past medical history     SURGICAL HISTORY: Past Surgical History:  Procedure Laterality Date   PENECTOMY      SOCIAL HISTORY: Social History   Socioeconomic History   Marital status: Single    Spouse name: Not on file   Number of children: Not on file   Years of education: Not on file   Highest education level: Not on file  Occupational History   Not on file  Tobacco Use   Smoking status: Every Day    Years: 20.00    Types: Cigarettes   Smokeless tobacco: Current    Types: Chew   Tobacco comments:    Occassionally  Vaping Use   Vaping Use: Not on file  Substance and Sexual Activity    Alcohol use: Yes    Alcohol/week: 2.0 standard drinks    Types: 2 Standard drinks or equivalent per week    Comment: rarely   Drug use: Not Currently   Sexual activity: Yes  Other Topics Concern   Not on file  Social History Narrative   Not on file   Social Determinants of Health   Financial Resource Strain: Not on file  Food Insecurity: Not on file  Transportation Needs: Not on file  Physical Activity: Not on file  Stress: Not on file  Social Connections: Not on file  Intimate Partner Violence: Not on file    FAMILY HISTORY: Family History  Problem Relation Age of Onset   Crohn's disease Mother    Lung disease Father    Diabetes Paternal Grandfather    CAD Neg Hx     ALLERGIES:  is allergic to other and triaminic fever reducer [acetaminophen].  MEDICATIONS:  No current outpatient medications on file.   No current facility-administered medications for this visit.    REVIEW OF SYSTEMS:   Constitutional: ( - ) fevers, ( - )  chills , ( - ) night sweats Eyes: ( - ) blurriness of vision, ( - ) double vision, ( - ) watery eyes Ears, nose, mouth, throat, and face: ( - ) mucositis, ( - ) sore throat Respiratory: ( - ) cough, ( - ) dyspnea, ( - ) wheezes  Cardiovascular: ( - ) palpitation, ( - ) chest discomfort, ( - ) lower extremity swelling Gastrointestinal:  ( - ) nausea, ( - ) heartburn, ( - ) change in bowel habits Skin: ( - ) abnormal skin rashes Lymphatics: ( - ) new lymphadenopathy, ( - ) easy bruising Neurological: ( - ) numbness, ( - ) tingling, ( - ) new weaknesses Behavioral/Psych: ( - ) mood change, ( - ) new changes  All other systems were reviewed with the patient and are negative.  PHYSICAL EXAMINATION: ECOG PERFORMANCE STATUS: 0 - Asymptomatic  Vitals:   04/22/21 0906  BP: 133/84  Pulse: 74  Resp: 17  Temp: 98.2 F (36.8 C)  SpO2: 98%   Filed Weights   04/22/21 0906  Weight: 205 lb 9.6 oz (93.3 kg)    GENERAL: well appearing male in NAD   SKIN: skin color, texture, turgor are normal, no rashes or significant lesions EYES: conjunctiva are pink and non-injected, sclera clear OROPHARYNX: no exudate, no erythema; lips, buccal mucosa, and tongue normal  LUNGS: clear to auscultation and percussion with normal breathing effort HEART: regular rate & rhythm and no murmurs and no lower extremity edema ABDOMEN: soft, non-tender, non-distended, normal bowel sounds Musculoskeletal: no cyanosis of digits and no clubbing  PSYCH: alert & oriented x 3, fluent speech NEURO: no focal motor/sensory deficits  LABORATORY DATA:  I have reviewed the data as listed CBC Latest Ref Rng & Units 04/22/2021 04/07/2021 02/03/2021  WBC 4.0 - 10.5 K/uL 11.5(H) 12.0(H) 11.2(H)  Hemoglobin 13.0 - 17.0 g/dL 84.5 36.4 68.0  Hematocrit 39.0 - 52.0 % 49.1 51.2(H) 52.0(H)  Platelets 150 - 400 K/uL 318 327 348    CMP Latest Ref Rng & Units 02/03/2021 04/07/2016 04/06/2016  Glucose 65 - 99 mg/dL 78 321(Y) 95  BUN 6 - 20 mg/dL 12 14 13   Creatinine 0.76 - 1.27 mg/dL 2.48 2.50  Sodium 134 - 144 mmol/L 141 138 138  Potassium 3.5 - 5.2 mmol/L 4.4 3.9 4.1  Chloride 96 - 106 mmol/L 104 108 104  CO2 20 - 29 mmol/L 21 22 24   Calcium 8.7 - 10.2 mg/dL 9.4 0.37) 8.9  Total Protein 6.0 - 8.5 g/dL 7.2 - -  Total Bilirubin 0.0 - 1.2 mg/dL ) - -  Alkaline Phos 44 - 121 IU/L 57 - -  AST 0 - 40 IU/L 22 - -  ALT 0 - 44 IU/L 28 - -    ASSESSMENT & PLAN Enzo Treu is a 40 y.o. male who presents to the clinic for leukocytosis and erythrocytosis.  The differentials for leukocytosis includes active infection, inflammatory process, medications, smoking, obstructive sleep apnea or bone marrow disorders.  Patient is a chronic smoker, with history of 0.5 ppd over 20 years. Earlier this year, he started to decrease his cigarette usage. CBC from earlier today shows WBC of 11.5 and absolute immature granulocytes of 0.13. He does not take any medications at this time.He does not  have any symptoms of an active infection. Likely cause of leukocytosis is smoking. He will proceed with additional labs today to check CMP, sedimentation rate, C-reactive protein and save smear.   #Leukocytosis and erythrocytosis, mild: --CBC today shows WBC 11.5 and Abs immature granulocyte 0.13 --Likely secondary to chronic cigarette use, advised smoking cessation. --Low suspicion for bone marrow disorders.  --Labs today to check CMP, sedimentation rate, c-reactive protein and save smear --If above workup is negative, patient will return in 6 months with repeat labs.  Orders Placed This Encounter  Procedures   CBC with Differential (Cancer Center Only)    Standing Status:   Future    Number of Occurrences:   1    Standing Expiration Date:   04/22/2022   Sedimentation rate    Standing Status:   Future    Standing Expiration Date:   04/22/2022   C-reactive protein    Standing Status:   Future    Standing Expiration Date:   04/22/2022   Save Smear (SSMR)    Standing Status:   Future    Standing Expiration Date:   04/22/2022   CMP (Cancer Center only)    Standing Status:   Future    Standing Expiration Date:   04/22/2022    All questions were answered. The patient knows to call the clinic with any problems, questions or concerns.  I have spent a total of 60 minutes minutes of face-to-face and non-face-to-face time, preparing to see the patient, obtaining and/or reviewing separately obtained history, performing a medically appropriate examination, counseling and educating the patient, ordering tests, documenting clinical information in the electronic health record, and care coordination.   Georga Kaufmann, PA-C Department of Hematology/Oncology [facility] at [facility] Phone: [phone]  Patient was seen with Dr. Leonides Schanz.   I have read the above note and personally examined the patient. I agree with the assessment and plan as noted above.  Briefly Mr. Rotan is  a 40 year old male who presents for evaluation of a neutrophilic leukocytosis. At this time strongly suggest that his findings are 2/2 to smoking. Encourage cessation. Will also evaluate for inflammatory disorders. If counts worsen or if other cell levels rise will consider MPN workup. RTC in 6 months time    Ulysees Barns, MD Department of Hematology/Oncology [facility] Phone: [phone] Pager: [phone] Email: [email or web address]

## 2021-04-23 ENCOUNTER — Telehealth: Payer: Self-pay

## 2021-04-23 NOTE — Telephone Encounter (Signed)
-----   Message from Briant Cedar, PA-C sent at 04/22/2021  1:14 PM EDT ----- Please let patient know that inflammatory markers were normal. So likely cause of elevated WBC is smoking. Plan to see patient back in 6 months and encourage smoking cessation as discussed during visit.

## 2021-04-23 NOTE — Telephone Encounter (Signed)
I spoke with pt and advised as indicated. He expressed understanding of this information. °

## 2021-07-18 NOTE — Progress Notes (Signed)
Erroneous encounter

## 2021-07-21 ENCOUNTER — Ambulatory Visit: Payer: Managed Care, Other (non HMO) | Admitting: Family

## 2021-07-22 ENCOUNTER — Encounter: Payer: Managed Care, Other (non HMO) | Admitting: Family

## 2021-07-22 DIAGNOSIS — Z13220 Encounter for screening for lipoid disorders: Secondary | ICD-10-CM

## 2021-07-24 ENCOUNTER — Ambulatory Visit
Admission: EM | Admit: 2021-07-24 | Discharge: 2021-07-24 | Disposition: A | Discharge status: Home / Self Care | Payer: Managed Care, Other (non HMO) | Attending: Internal Medicine | Admitting: Internal Medicine

## 2021-07-24 ENCOUNTER — Other Ambulatory Visit: Payer: Self-pay

## 2021-07-24 DIAGNOSIS — J029 Acute pharyngitis, unspecified: Secondary | ICD-10-CM | POA: Insufficient documentation

## 2021-07-24 DIAGNOSIS — J069 Acute upper respiratory infection, unspecified: Secondary | ICD-10-CM | POA: Diagnosis present

## 2021-07-24 LAB — POCT RAPID STREP A (OFFICE): Rapid Strep A Screen: NEGATIVE

## 2021-07-24 MED ORDER — CETIRIZINE HCL 10 MG PO TABS: 10.0000 mg | ORAL_TABLET | Freq: Every day | ORAL | 0 refills | Status: DC

## 2021-07-24 MED ORDER — FLUTICASONE PROPIONATE 50 MCG/ACT NA SUSP: 1.0000 | Freq: Every day | NASAL | 0 refills | Status: DC

## 2021-07-24 MED ORDER — BENZONATATE 100 MG PO CAPS: 100.0000 mg | ORAL_CAPSULE | Freq: Three times a day (TID) | ORAL | 0 refills | Status: DC | PRN

## 2021-07-24 NOTE — ED Provider Notes (Signed)
Leonard Barrett    CSN: [id number] Arrival date & time: 07/24/21  0810      History   Chief Complaint Chief Complaint  Patient presents with   Sore Throat    HPI Farmer Mccahill is a 40 y.o. male.   Patient presents with 1 week history of nasal congestion, nonproductive cough, sore throat, body aches, fever.  T-max at home was 100 a few days prior.  Children have had similar symptoms recently.  Denies chest pain, shortness of breath, ear pain, nausea, vomiting, diarrhea, abdominal pain.  Patient has not taken any medications to help alleviate symptoms.   Sore Throat   Past Medical History:  Diagnosis Date   No pertinent past medical history     Patient Active Problem List   Diagnosis Date Noted   Leukocytosis 04/22/2021   Erythrocytosis 04/22/2021   NSTEMI (non-ST elevated myocardial infarction) (HCC) 04/06/2016    Past Surgical History:  Procedure Laterality Date   PENECTOMY         Home Medications    Prior to Admission medications   Medication Sig Start Date End Date Taking? Authorizing Provider  benzonatate (TESSALON) 100 MG capsule Take 1 capsule (100 mg total) by mouth every 8 (eight) hours as needed for cough. 07/24/21  Yes Anndrea Mihelich, Acie Fredrickson, FNP  cetirizine (ZYRTEC) 10 MG tablet Take 1 tablet (10 mg total) by mouth daily for 10 days. 07/24/21 08/03/21 Yes Wilhelmina Hark, Acie Fredrickson, FNP  fluticasone (FLONASE) 50 MCG/ACT nasal spray Place 1 spray into both nostrils daily for 3 days. 07/24/21 07/27/21 Yes Pammy Vesey, Acie Fredrickson, FNP    Family History Family History  Problem Relation Age of Onset   Crohn's disease Mother    Lung disease Father    Diabetes Paternal Grandfather    CAD Neg Hx     Social History Social History   Tobacco Use   Smoking status: Every Day    Years: 20.00    Types: Cigarettes   Smokeless tobacco: Current    Types: Chew   Tobacco comments:    Occassionally  Substance Use Topics   Alcohol use: Yes    Alcohol/week: 2.0 standard  drinks    Types: 2 Standard drinks or equivalent per week    Comment: rarely   Drug use: Not Currently     Allergies   Other and Triaminic fever reducer [acetaminophen]   Review of Systems Review of Systems Per HPI  Physical Exam Triage Vital Signs ED Triage Vitals [07/24/21 0819]  Enc Vitals Group     BP (!) 135/92     Pulse Rate 79     Resp 18     Temp 98 F (36.7 C)     Temp Source Oral     SpO2 95 %     Weight      Height      Head Circumference      Peak Flow      Pain Score 0     Pain Loc      Pain Edu?      Excl. in GC?    No data found.  Updated Vital Signs BP (!) 135/92 (BP Location: Left Arm)    Pulse 79    Temp 98 F (36.7 C) (Oral)    Resp 18    SpO2 95%   Visual Acuity Right Eye Distance:   Left Eye Distance:   Bilateral Distance:    Right Eye Near:   Left Eye Near:  Bilateral Near:     Physical Exam Constitutional:      General: He is not in acute distress.    Appearance: Normal appearance. He is not toxic-appearing or diaphoretic.  HENT:     Head: Normocephalic and atraumatic.     Right Ear: Tympanic membrane and ear canal normal.     Left Ear: Tympanic membrane and ear canal normal.     Nose: Congestion present.     Mouth/Throat:     Mouth: Mucous membranes are moist.     Pharynx: Posterior oropharyngeal erythema present.  Eyes:     Extraocular Movements: Extraocular movements intact.     Conjunctiva/sclera: Conjunctivae normal.     Pupils: Pupils are equal, round, and reactive to light.  Cardiovascular:     Rate and Rhythm: Normal rate and regular rhythm.     Pulses: Normal pulses.     Heart sounds: Normal heart sounds.  Pulmonary:     Effort: Pulmonary effort is normal. No respiratory distress.     Breath sounds: Normal breath sounds. No stridor. No wheezing, rhonchi or rales.  Abdominal:     General: Abdomen is flat. Bowel sounds are normal.     Palpations: Abdomen is soft.  Musculoskeletal:        General: Normal range  of motion.     Cervical back: Normal range of motion.  Skin:    General: Skin is warm and dry.  Neurological:     General: No focal deficit present.     Mental Status: He is alert and oriented to person, place, and time. Mental status is at baseline.  Psychiatric:        Mood and Affect: Mood normal.        Behavior: Behavior normal.     UC Treatments / Results  Labs (all labs ordered are listed, but only abnormal results are displayed) Labs Reviewed  CULTURE, GROUP A STREP ([employer])  COVID-19, FLU A+B NAA  POCT RAPID STREP A (OFFICE)    EKG   Radiology No results found.  Procedures Procedures (including critical Barrett time)  Medications Ordered in UC Medications - No data to display  Initial Impression / Assessment and Plan / UC Course  I have reviewed the triage vital signs and the nursing notes.  Pertinent labs & imaging results that were available during my Barrett of the patient were reviewed by me and considered in my medical decision making (see chart for details).     Patient presents with symptoms likely from a viral upper respiratory infection. Differential includes bacterial pneumonia, sinusitis, allergic rhinitis, COVID-19, flu. Do not suspect underlying cardiopulmonary process. Symptoms seem unlikely related to ACS, CHF or COPD exacerbations, pneumonia, pneumothorax. Patient is nontoxic appearing and not in need of emergent medical intervention.  Rapid strep is negative.  Throat culture, COVID-19, flu test pending.  Do not think that chest imaging is necessary given that there are no adventitious lung sounds or signs of respiratory compromise on exam.  Recommended symptom control with over the counter medications: Daily oral anti-histamine, Oral decongestant or IN corticosteroid, saline irrigations, cepacol lozenges, Robitussin, Delsym, honey tea.  Patient offered prescriptions.  Return if symptoms fail to improve in 1-2 weeks or you develop shortness of breath,  chest pain, severe headache. Patient states understanding and is agreeable.  Discharged with PCP followup.  Final Clinical Impressions(s) / UC Diagnoses   Final diagnoses:  Sore throat  Viral upper respiratory tract infection with cough     Discharge  Instructions      It appears that you have a viral upper respiratory infection that should resolve in the next few days with symptomatic treatment.  You have been prescribed 3 medications to help alleviate symptoms.  Rapid strep was negative.  Throat culture, COVID-19, flu test is pending.  We will call if it is positive.    ED Prescriptions     Medication Sig Dispense Auth. Provider   benzonatate (TESSALON) 100 MG capsule Take 1 capsule (100 mg total) by mouth every 8 (eight) hours as needed for cough. 21 capsule [place], [place] E, [place]   cetirizine (ZYRTEC) 10 MG tablet Take 1 tablet (10 mg total) by mouth daily for 10 days. 30 tablet [place], [place] E, [place]   fluticasone [facility]) 50 MCG/ACT nasal spray Place 1 spray into both nostrils daily for 3 days. 16 g Teodora Medici, [place]      PDMP not reviewed this encounter.   Teodora Medici, [place] 07/24/21 [phone]

## 2021-07-24 NOTE — ED Triage Notes (Signed)
Pt c/o sore throat, cough, nasal congestion, body aches, fever (89f at home).  Denies headache, chills, nausea, vomiting, diarrhea, constipation.  Onset a couple of days ago.

## 2021-07-24 NOTE — Discharge Instructions (Addendum)
It appears that you have a viral upper respiratory infection that should resolve in the next few days with symptomatic treatment.  You have been prescribed 3 medications to help alleviate symptoms.  Rapid strep was negative.  Throat culture, COVID-19, flu test is pending.  We will call if it is positive.

## 2021-07-25 LAB — COVID-19, FLU A+B NAA
Influenza A, NAA: NOT DETECTED
Influenza B, NAA: NOT DETECTED
SARS-CoV-2, NAA: NOT DETECTED

## 2021-07-27 LAB — CULTURE, GROUP A STREP (THRC)

## 2021-10-20 ENCOUNTER — Other Ambulatory Visit: Payer: Self-pay | Admitting: Physician Assistant

## 2021-10-20 DIAGNOSIS — D72829 Elevated white blood cell count, unspecified: Secondary | ICD-10-CM

## 2021-10-21 ENCOUNTER — Inpatient Hospital Stay (HOSPITAL_BASED_OUTPATIENT_CLINIC_OR_DEPARTMENT_OTHER): Payer: Managed Care, Other (non HMO) | Admitting: Physician Assistant

## 2021-10-21 ENCOUNTER — Other Ambulatory Visit: Payer: Self-pay

## 2021-10-21 ENCOUNTER — Inpatient Hospital Stay: Payer: Managed Care, Other (non HMO) | Attending: Physician Assistant

## 2021-10-21 VITALS — BP 127/82 | HR 67 | Temp 98.4°F | Resp 17 | Wt 215.5 lb

## 2021-10-21 DIAGNOSIS — D72829 Elevated white blood cell count, unspecified: Secondary | ICD-10-CM | POA: Insufficient documentation

## 2021-10-21 DIAGNOSIS — Z87891 Personal history of nicotine dependence: Secondary | ICD-10-CM | POA: Diagnosis not present

## 2021-10-21 LAB — CMP (CANCER CENTER ONLY)
ALT: 36 U/L (ref 0–44)
AST: 25 U/L (ref 15–41)
Albumin: 4.3 g/dL (ref 3.5–5.0)
Alkaline Phosphatase: 50 U/L (ref 38–126)
Anion gap: 4 — ABNORMAL LOW (ref 5–15)
BUN: 12 mg/dL (ref 6–20)
CO2: 29 mmol/L (ref 22–32)
Calcium: 9.3 mg/dL (ref 8.9–10.3)
Chloride: 106 mmol/L (ref 98–111)
Creatinine: 0.92 mg/dL (ref 0.61–1.24)
GFR, Estimated: >60 mL/min (ref >60)
Glucose, Bld: 93 mg/dL (ref 70–99)
Potassium: 4.3 mmol/L (ref 3.5–5.1)
Sodium: 139 mmol/L (ref 135–145)
Total Bilirubin: 1.4 mg/dL — ABNORMAL HIGH (ref 0.3–1.2)
Total Protein: 7.5 g/dL (ref 6.5–8.1)

## 2021-10-21 LAB — CBC WITH DIFFERENTIAL (CANCER CENTER ONLY)
Abs Immature Granulocytes: 0.19 10*3/uL — ABNORMAL HIGH (ref 0.00–0.07)
Basophils Absolute: 0.1 10*3/uL (ref 0.0–0.1)
Basophils Relative: 1 %
Eosinophils Absolute: 0.3 10*3/uL (ref 0.0–0.5)
Eosinophils Relative: 3 %
HCT: 49.4 % (ref 39.0–52.0)
Hemoglobin: 16.5 g/dL (ref 13.0–17.0)
Immature Granulocytes: 2 %
Lymphocytes Relative: 28 %
Lymphs Abs: 3 10*3/uL (ref 0.7–4.0)
MCH: 28.4 pg (ref 26.0–34.0)
MCHC: 33.4 g/dL (ref 30.0–36.0)
MCV: 85 fL (ref 80.0–100.0)
Monocytes Absolute: 0.7 10*3/uL (ref 0.1–1.0)
Monocytes Relative: 7 %
Neutro Abs: 6.6 10*3/uL (ref 1.7–7.7)
Neutrophils Relative %: 59 %
Platelet Count: 334 10*3/uL (ref 150–400)
RBC: 5.81 MIL/uL (ref 4.22–5.81)
RDW: 12.7 % (ref 11.5–15.5)
WBC Count: 10.9 10*3/uL — ABNORMAL HIGH (ref 4.0–10.5)
nRBC: 0 % (ref 0.0–0.2)

## 2021-10-21 NOTE — Progress Notes (Signed)
?[place] ?[phone]   [phone] ? ?PROGRESS NOTE ? ?Patient Care Team: ?Camillia Herter, NP as PCP - General (Nurse Practitioner) ? ?Hematological/Oncological History ?1) Prior Labs: ?-09/23/2015: WBC 12.4 (H), Hgb 16.7, Plt 319 ?-04/06/2016: WBC 14.8 (H), Hgb 16.3, Plt 265 ?-02/03/2021: WBC 11.2 (H), RBC 5.94 (H), Hgb 17.4, Hct 52.0 (H), Plt 348 ?-04/07/2021: WBC 12.0 (H), RBC 5.94 (H), Hgb 17.5, Hct 51.2 (H), Plt 327 ? ?2) 04/22/2021: Establish care with Dede Query PA-C ? ?CHIEF COMPLAINTS/PURPOSE OF CONSULTATION:  ?Leukocytosis ? ?HISTORY OF PRESENTING ILLNESS:  ?Leonard Barrett 41 y.o. male returns for a follow up for leukocytosis.  He is unaccompanied for this visit.  Patient was last seen on 04/22/2021.  In the interim, patient stopped smoking cigarettes approximately five months ago.  ? ?On exam today, Leonard Barrett reports that his energy levels are fairly stable.  He is able to complete all his daily activities on his own.  Patient denies any nausea, vomiting or abdominal pain.  His bowel habits are regular without any diarrhea or constipation.  She denies easy bruising or signs of active bleeding. He denies any fevers, chills, night sweats, shortness of breath, chest pain, cough, rash dysuria.  He has no other complaints.  Rest of the 10 point ROS is below. ? ?MEDICAL HISTORY:  ?Past Medical History:  ?Diagnosis Date  ? No pertinent past medical history   ? ? ?SURGICAL HISTORY: ?Past Surgical History:  ?Procedure Laterality Date  ? PENECTOMY    ? ? ?SOCIAL HISTORY: ?Social History  ? ?Socioeconomic History  ? Marital status: Single  ?  Spouse name: Not on file  ? Number of children: Not on file  ? Years of education: Not on file  ? Highest education level: Not on file  ?Occupational History  ? Not on file  ?Tobacco Use  ? Smoking status: Every Day  ?  Years: 20.00  ?  Types: Cigarettes  ? Smokeless tobacco: Current  ?  Types: Chew  ? Tobacco comments:  ?  Occassionally   ?Vaping Use  ? Vaping Use: Not on file  ?Substance and Sexual Activity  ? Alcohol use: Yes  ?  Alcohol/week: 2.0 standard drinks  ?  Types: 2 Standard drinks or equivalent per week  ?  Comment: rarely  ? Drug use: Not Currently  ? Sexual activity: Yes  ?Other Topics Concern  ? Not on file  ?Social History Narrative  ? Not on file  ? ?Social Determinants of Health  ? ?Financial Resource Strain: Not on file  ?Food Insecurity: Not on file  ?Transportation Needs: Not on file  ?Physical Activity: Not on file  ?Stress: Not on file  ?Social Connections: Not on file  ?Intimate Partner Violence: Not on file  ? ? ?FAMILY HISTORY: ?Family History  ?Problem Relation Age of Onset  ? Crohn's disease Mother   ? Lung disease Father   ? Diabetes Paternal Grandfather   ? CAD Neg Hx   ? ? ?ALLERGIES:  is allergic to other and triaminic fever reducer [acetaminophen]. ? ?MEDICATIONS:  ?Current Outpatient Medications  ?Medication Sig Dispense Refill  ? benzonatate (TESSALON) 100 MG capsule Take 1 capsule (100 mg total) by mouth every 8 (eight) hours as needed for cough. 21 capsule 0  ? cetirizine (ZYRTEC) 10 MG tablet Take 1 tablet (10 mg total) by mouth daily for 10 days. 30 tablet 0  ? fluticasone (FLONASE) 50 MCG/ACT nasal spray Place 1 spray into both nostrils  daily for 3 days. 16 g 0  ? ?No current facility-administered medications for this visit.  ? ? ?REVIEW OF SYSTEMS:   ?Constitutional: ( - ) fevers, ( - )  chills , ( - ) night sweats ?Eyes: ( - ) blurriness of vision, ( - ) double vision, ( - ) watery eyes ?Ears, nose, mouth, throat, and face: ( - ) mucositis, ( - ) sore throat ?Respiratory: ( - ) cough, ( - ) dyspnea, ( - ) wheezes ?Cardiovascular: ( - ) palpitation, ( - ) chest discomfort, ( - ) lower extremity swelling ?Gastrointestinal:  ( - ) nausea, ( - ) heartburn, ( - ) change in bowel habits ?Skin: ( - ) abnormal skin rashes ?Lymphatics: ( - ) new lymphadenopathy, ( - ) easy bruising ?Neurological: ( - ) numbness, ( -  ) tingling, ( - ) new weaknesses ?Behavioral/Psych: ( - ) mood change, ( - ) new changes  ?All other systems were reviewed with the patient and are negative. ? ?PHYSICAL EXAMINATION: ?ECOG PERFORMANCE STATUS: 0 - Asymptomatic ? ?Vitals:  ? 10/21/21 1006  ?BP: 127/82  ?Pulse: 67  ?Resp: 17  ?Temp: 98.4 ?F (36.9 ?C)  ?SpO2: 97%  ? ?Filed Weights  ? 10/21/21 1006  ?Weight: 215 lb 8 oz (97.8 kg)  ? ? ?GENERAL: well appearing male in NAD  ?SKIN: skin color, texture, turgor are normal, no rashes or significant lesions ?EYES: conjunctiva are pink and non-injected, sclera clear ?OROPHARYNX: no exudate, no erythema; lips, buccal mucosa, and tongue normal  ?LUNGS: clear to auscultation and percussion with normal breathing effort ?HEART: regular rate & rhythm and no murmurs and no lower extremity edema ?ABDOMEN: soft, non-tender, non-distended, normal bowel sounds ?Musculoskeletal: no cyanosis of digits and no clubbing  ?PSYCH: alert & oriented x 3, fluent speech ?NEURO: no focal motor/sensory deficits ? ?LABORATORY DATA:  ?I have reviewed the data as listed ?CBC Latest Ref Rng & Units 10/21/2021 04/22/2021 04/07/2021  ?WBC 4.0 - 10.5 K/uL 10.9(H) 11.5(H) 12.0(H)  ?Hemoglobin 13.0 - 17.0 g/dL 16.5 17.0 17.5  ?Hematocrit 39.0 - 52.0 % 49.4 49.1 51.2(H)  ?Platelets 150 - 400 K/uL 334 318 327  ? ? ?CMP Latest Ref Rng & Units 10/21/2021 04/22/2021 02/03/2021  ?Glucose 70 - 99 mg/dL 93 89 78  ?BUN 6 - 20 mg/dL 12 12 12   ?Creatinine 0.61 - 1.24 mg/dL 0.92 0.87 0.91  ?Sodium 135 - 145 mmol/L 139 140 141  ?Potassium 3.5 - 5.1 mmol/L 4.3 4.1 4.4  ?Chloride 98 - 111 mmol/L 106 107 104  ?CO2 22 - 32 mmol/L 29 23 21   ?Calcium 8.9 - 10.3 mg/dL 9.3 9.4 9.4  ?Total Protein 6.5 - 8.1 g/dL 7.5 7.4 7.2  ?Total Bilirubin 0.3 - 1.2 mg/dL 1.4(H) 2.1(H) 1.5(H)  ?Alkaline Phos 38 - 126 U/L 50 50 57  ?AST 15 - 41 U/L 25 19 22   ?ALT 0 - 44 U/L 36 23 28  ? ? ?ASSESSMENT & PLAN ?Leonard Barrett is a 41 y.o. male who returns for a follow up.   ? ?#Leukocytosis: ?--Likely secondary to chronic cigarette use, patient has stopped smoking 5 months ago. ?--Inflammatory markers were normal on 04/22/2021 ?--Labs today show improvement of leukocytosis with WBC 10.9. ?--Low suspicion for bone marrow disorder.  ?--Continue to monitor.  ?--RTC in 6 months to assure continued improvement.  ? ?Orders Placed This Encounter  ?Procedures  ? CBC with Differential ([place] Only)  ?  Standing Status:   Future  ?  Standing Expiration Date:   10/22/2022  ? CMP ([place] only)  ?  Standing Status:   Future  ?  Standing Expiration Date:   10/22/2022  ? ? ?All questions were answered. The patient knows to call the clinic with any problems, questions or concerns. ? ?I have spent a total of 25 minutes minutes of face-to-face and non-face-to-face time, preparing to see the patient, performing a medically appropriate examination, counseling and educating the patient, ordering tests, documenting clinical information in the electronic health record, and care coordination.  ? ?Dede Query, PA-C ?Department of Hematology/Oncology ?[place] [facility] ?Phone: [phone] ? ? ?

## 2021-11-23 ENCOUNTER — Ambulatory Visit
Admission: EM | Admit: 2021-11-23 | Discharge: 2021-11-23 | Disposition: A | Discharge status: Home / Self Care | Payer: Managed Care, Other (non HMO) | Attending: Internal Medicine | Admitting: Internal Medicine

## 2021-11-23 ENCOUNTER — Encounter: Payer: Self-pay | Admitting: Emergency Medicine

## 2021-11-23 DIAGNOSIS — Z113 Encounter for screening for infections with a predominantly sexual mode of transmission: Secondary | ICD-10-CM | POA: Diagnosis present

## 2021-11-23 DIAGNOSIS — R369 Urethral discharge, unspecified: Secondary | ICD-10-CM | POA: Diagnosis not present

## 2021-11-23 DIAGNOSIS — R300 Dysuria: Secondary | ICD-10-CM

## 2021-11-23 LAB — POCT URINALYSIS DIP (MANUAL ENTRY)
Bilirubin, UA: NEGATIVE
Glucose, UA: NEGATIVE mg/dL
Ketones, POC UA: NEGATIVE mg/dL
Nitrite, UA: NEGATIVE
Protein Ur, POC: 30 mg/dL — AB
Spec Grav, UA: >=1.03 — AB (ref 1.010–1.025)
Urobilinogen, UA: 0.2 E.U./dL
pH, UA: 5.5 (ref 5.0–8.0)

## 2021-11-23 MED ORDER — CEFTRIAXONE SODIUM 1 G IJ SOLR
1.0000 g | Freq: Once | INTRAMUSCULAR | Status: AC
  Administered 2021-11-23: 1 g via INTRAMUSCULAR

## 2021-11-23 NOTE — Discharge Instructions (Signed)
You are being treated with antibiotic injection today that will cover for urinary tract infection as well as gonorrhea if it is present.  STD testing has been sent off that we will test for gonorrhea, chlamydia, trichomonas.  We will call if any of them are positive.  Please refrain from sexual activity until test results and treatment are complete. ?

## 2021-11-23 NOTE — ED Triage Notes (Signed)
Patient c/o possible STI.  Having dysuria, lower abdominal pain and penile discharge for a couple of days. ?

## 2021-11-23 NOTE — ED Provider Notes (Signed)
?[facility] ? ? ? ?CSN: [id number] ?Arrival date & time: 11/23/21  0805 ? ? ?  ? ?History   ?Chief Complaint ?Chief Complaint  ?Patient presents with  ? Exposure to STD  ? ? ?HPI ?Leonard Barrett is a 41 y.o. male.  ? ?Patient presents for penile discharge, dysuria, lower abdominal pain that has been present for a few days.  Patient requesting STD testing.  Denies any known exposure to STD but has had unprotected sexual intercourse recently.  Denies urinary frequency, testicular pain, fever, back pain.  Patient does not want blood work for HIV or syphilis. ? ? ?Exposure to STD ? ? ?Past Medical History:  ?Diagnosis Date  ? No pertinent past medical history   ? ? ?Patient Active Problem List  ? Diagnosis Date Noted  ? Leukocytosis 04/22/2021  ? Erythrocytosis 04/22/2021  ? NSTEMI (non-ST elevated myocardial infarction) (HCC) 04/06/2016  ? ? ?Past Surgical History:  ?Procedure Laterality Date  ? PENECTOMY    ? ? ? ? ? ?Home Medications   ? ?Prior to Admission medications   ?Not on File  ? ? ?Family History ?Family History  ?Problem Relation Age of Onset  ? Crohn's disease Mother   ? Lung disease Father   ? Diabetes Paternal Grandfather   ? CAD Neg Hx   ? ? ?Social History ?Social History  ? ?Tobacco Use  ? Smoking status: Every Day  ?  Years: 20.00  ?  Types: Cigarettes  ? Smokeless tobacco: Current  ?  Types: Chew  ? Tobacco comments:  ?  Occassionally  ?Substance Use Topics  ? Alcohol use: Yes  ?  Alcohol/week: 2.0 standard drinks  ?  Types: 2 Standard drinks or equivalent per week  ?  Comment: rarely  ? Drug use: Not Currently  ? ? ? ?Allergies   ?Other and Triaminic fever reducer [acetaminophen] ? ? ?Review of Systems ?Review of Systems ?Per HPI ? ?Physical Exam ?Triage Vital Signs ?ED Triage Vitals  ?Enc Vitals Group  ?   BP 11/23/21 0821 134/83  ?   Pulse Rate 11/23/21 0821 79  ?   Resp 11/23/21 0821 18  ?   Temp 11/23/21 0821 98 ?F (36.7 ?C)  ?   Temp Source 11/23/21 0821 Oral  ?   SpO2 11/23/21 0821 96  %  ?   Weight 11/23/21 0822 210 lb (95.3 kg)  ?   Height 11/23/21 0822 6' (1.829 m)  ?   Head Circumference --   ?   Peak Flow --   ?   Pain Score 11/23/21 0822 5  ?   Pain Loc --   ?   Pain Edu? --   ?   Excl. in GC? --   ? ?No data found. ? ?Updated Vital Signs ?BP 134/83 (BP Location: Left Arm)   Pulse 79   Temp 98 ?F (36.7 ?C) (Oral)   Resp 18   Ht 6' (1.829 m)   Wt 210 lb (95.3 kg)   SpO2 96%   BMI 28.48 kg/m?  ? ?Visual Acuity ?Right Eye Distance:   ?Left Eye Distance:   ?Bilateral Distance:   ? ?Right Eye Near:   ?Left Eye Near:    ?Bilateral Near:    ? ?Physical Exam ?Constitutional:   ?   General: He is not in acute distress. ?   Appearance: Normal appearance. He is not toxic-appearing or diaphoretic.  ?HENT:  ?   Head: Normocephalic and atraumatic.  ?Eyes:  ?  Extraocular Movements: Extraocular movements intact.  ?   Conjunctiva/sclera: Conjunctivae normal.  ?Cardiovascular:  ?   Rate and Rhythm: Normal rate and regular rhythm.  ?   Pulses: Normal pulses.  ?   Barrett sounds: Normal Barrett sounds.  ?Pulmonary:  ?   Effort: Pulmonary effort is normal.  ?   Breath sounds: Normal breath sounds.  ?Abdominal:  ?   General: Bowel sounds are normal. There is no distension.  ?   Palpations: Abdomen is soft.  ?   Tenderness: There is no abdominal tenderness.  ?Genitourinary: ?   Comments: Deferred with shared decision making.  Self swab performed. ?Neurological:  ?   General: No focal deficit present.  ?   Mental Status: He is alert and oriented to person, place, and time. Mental status is at baseline.  ?Psychiatric:     ?   Mood and Affect: Mood normal.     ?   Behavior: Behavior normal.     ?   Thought Content: Thought content normal.     ?   Judgment: Judgment normal.  ? ? ? ?UC Treatments / Results  ?Labs ?(all labs ordered are listed, but only abnormal results are displayed) ?Labs Reviewed  ?POCT URINALYSIS DIP (MANUAL ENTRY) - Abnormal; Notable for the following components:  ?    Result Value  ? Spec  Grav, UA >=1.030 (*)   ? Blood, UA trace-intact (*)   ? Protein Ur, POC =30 (*)   ? Leukocytes, UA Small (1+) (*)   ? All other components within normal limits  ?URINE CULTURE  ?CYTOLOGY, (ORAL, ANAL, URETHRAL) ANCILLARY ONLY  ? ? ?EKG ? ? ?Radiology ?No results found. ? ?Procedures ?Procedures (including critical care time) ? ?Medications Ordered in UC ?Medications  ?cefTRIAXone (ROCEPHIN) injection 1 g (1 g Intramuscular Given 11/23/21 0858)  ? ? ?Initial Impression / Assessment and Plan / UC Course  ?I have reviewed the triage vital signs and the nursing notes. ? ?Pertinent labs & imaging results that were available during my care of the patient were reviewed by me and considered in my medical decision making (see chart for details). ? ?  ? ?Urinalysis showing small amounts of leukocytes which could indicate urinary tract infection or STD.  High suspicion for STD given patient's age and recent unprotected sexual intercourse.  Will opt to treat with 1 g of  IM Rocephin to cover for UTI and possible gonorrhea.  Cytology swab pending.  Will change or add treatment once results are complete.  Patient advised to refrain from sexual activity until test results and treatment are complete.  Patient verbalized understanding and was agreeable with plan. ?Final Clinical Impressions(s) / UC Diagnoses  ? ?Final diagnoses:  ?Penile discharge  ?Screening examination for venereal disease  ?Dysuria  ? ? ? ?Discharge Instructions   ? ?  ?You are being treated with antibiotic injection today that will cover for urinary tract infection as well as gonorrhea if it is present.  STD testing has been sent off that we will test for gonorrhea, chlamydia, trichomonas.  We will call if any of them are positive.  Please refrain from sexual activity until test results and treatment are complete. ? ? ? ?ED Prescriptions   ?None ?  ? ?PDMP not reviewed this encounter. ?  ?Gustavus Bryant, [place] ?11/23/21 3382 ? ?

## 2021-11-24 LAB — URINE CULTURE: Culture: <10000 — AB

## 2021-11-24 LAB — CYTOLOGY, (ORAL, ANAL, URETHRAL) ANCILLARY ONLY
Chlamydia: NEGATIVE
Comment: NEGATIVE
Comment: NEGATIVE
Comment: NORMAL
Neisseria Gonorrhea: NEGATIVE
Trichomonas: POSITIVE — AB

## 2021-11-25 ENCOUNTER — Telehealth (HOSPITAL_COMMUNITY): Payer: Self-pay | Admitting: Emergency Medicine

## 2021-11-25 MED ORDER — METRONIDAZOLE 500 MG PO TABS: 2000.0000 mg | ORAL_TABLET | Freq: Once | ORAL | 0 refills | Status: AC

## 2021-12-07 ENCOUNTER — Emergency Department (INDEPENDENT_AMBULATORY_CARE_PROVIDER_SITE_OTHER): Payer: Managed Care, Other (non HMO)

## 2021-12-07 ENCOUNTER — Emergency Department (INDEPENDENT_AMBULATORY_CARE_PROVIDER_SITE_OTHER)
Admission: EM | Admit: 2021-12-07 | Discharge: 2021-12-07 | Disposition: A | Discharge status: Home / Self Care | Payer: Managed Care, Other (non HMO) | Source: Home / Self Care

## 2021-12-07 DIAGNOSIS — R1032 Left lower quadrant pain: Secondary | ICD-10-CM | POA: Diagnosis not present

## 2021-12-07 DIAGNOSIS — R110 Nausea: Secondary | ICD-10-CM | POA: Diagnosis not present

## 2021-12-07 DIAGNOSIS — R1084 Generalized abdominal pain: Secondary | ICD-10-CM

## 2021-12-07 MED ORDER — IOHEXOL 300 MG/ML  SOLN
100.0000 mL | Freq: Once | INTRAMUSCULAR | Status: AC | PRN
  Administered 2021-12-07: 100 mL via INTRAVENOUS

## 2021-12-07 NOTE — Discharge Instructions (Addendum)
Advised/informed patient of CT of abdomen and pelvis with contrast results today with hard copy provided to patient.  Advised patient follow-up with PCP for GI referral for further evaluation. ?

## 2021-12-07 NOTE — ED Provider Notes (Signed)
?[facility] ? ? ? ?CSN: [id number] ?Arrival date & time: 12/07/21  0813 ? ? ?  ? ?History   ?Chief Complaint ?Chief Complaint  ?Patient presents with  ? Abdominal Pain  ?  Abdominal pain. X2 days  ? ? ?HPI ?Leonard Barrett is a 41 y.o. male.  ? ?HPI 41 year old male presents with intermittent abdominal pain x2 days PMH significant for leukocytosis.  Patient is a current every day cigarette smoker, reports 1 pack/day with 20-pack-year history. ? ?Past Medical History:  ?Diagnosis Date  ? No pertinent past medical history   ? ? ?Patient Active Problem List  ? Diagnosis Date Noted  ? Leukocytosis 04/22/2021  ? Erythrocytosis 04/22/2021  ? NSTEMI (non-ST elevated myocardial infarction) (HCC) 04/06/2016  ? ? ?Past Surgical History:  ?Procedure Laterality Date  ? PENECTOMY    ? ? ? ? ? ?Home Medications   ? ?Prior to Admission medications   ?Not on File  ? ? ?Family History ?Family History  ?Problem Relation Age of Onset  ? Crohn's disease Mother   ? Lung disease Father   ? Diabetes Paternal Grandfather   ? CAD Neg Hx   ? ? ?Social History ?Social History  ? ?Tobacco Use  ? Smoking status: Every Day  ?  Years: 20.00  ?  Types: Cigarettes  ? Smokeless tobacco: Current  ?  Types: Chew  ? Tobacco comments:  ?  Occassionally  ?Substance Use Topics  ? Alcohol use: Yes  ?  Alcohol/week: 2.0 standard drinks  ?  Types: 2 Standard drinks or equivalent per week  ?  Comment: rarely  ? Drug use: Not Currently  ? ? ? ?Allergies   ?Other and Triaminic fever reducer [acetaminophen] ? ? ?Review of Systems ?Review of Systems  ?Gastrointestinal:  Positive for abdominal pain.  ? ? ?Physical Exam ?Triage Vital Signs ?ED Triage Vitals  ?Enc Vitals Group  ?   BP 12/07/21 0826 (!) 152/90  ?   Pulse Rate 12/07/21 0826 78  ?   Resp 12/07/21 0826 18  ?   Temp 12/07/21 0826 98.5 ?F (36.9 ?C)  ?   Temp Source 12/07/21 0826 Oral  ?   SpO2 12/07/21 0826 99 %  ?   Weight 12/07/21 0824 210 lb (95.3 kg)  ?   Height 12/07/21 0824 6' (1.829 m)  ?    Head Circumference --   ?   Peak Flow --   ?   Pain Score 12/07/21 0823 8  ?   Pain Loc --   ?   Pain Edu? --   ?   Excl. in GC? --   ? ?No data found. ? ?Updated Vital Signs ?BP (!) 152/90 (BP Location: Right Arm)   Pulse 78   Temp 98.5 ?F (36.9 ?C) (Oral)   Resp 18   Ht 6' (1.829 m)   Wt 210 lb (95.3 kg)   SpO2 99%   BMI 28.48 kg/m?  ? ?  ? ?Physical Exam ?Vitals and nursing note reviewed.  ?Constitutional:   ?   General: He is not in acute distress. ?   Appearance: He is well-developed and normal weight. He is not ill-appearing.  ?HENT:  ?   Head: Normocephalic and atraumatic.  ?   Mouth/Throat:  ?   Mouth: Mucous membranes are moist.  ?   Pharynx: Oropharynx is clear.  ?Eyes:  ?   Extraocular Movements: Extraocular movements intact.  ?   Pupils: Pupils are equal, round, and  reactive to light.  ?Cardiovascular:  ?   Rate and Rhythm: Normal rate and regular rhythm.  ?   Heart sounds: Normal heart sounds. No murmur heard. ?Pulmonary:  ?   Effort: Pulmonary effort is normal.  ?   Breath sounds: Normal breath sounds. No wheezing, rhonchi or rales.  ?Abdominal:  ?   General: Abdomen is flat. Bowel sounds are absent.  ?   Palpations: Abdomen is soft. There is no shifting dullness, fluid wave, hepatomegaly, splenomegaly, mass or pulsatile mass.  ?   Tenderness: There is abdominal tenderness in the right upper quadrant, right lower quadrant, epigastric area, suprapubic area, left upper quadrant and left lower quadrant. There is no right CVA tenderness, left CVA tenderness or rebound. Negative signs include Murphy's sign, McBurney's sign and psoas sign.  ?Skin: ?   General: Skin is warm and dry.  ?Neurological:  ?   General: No focal deficit present.  ?   Mental Status: He is alert and oriented to person, place, and time.  ? ? ? ?UC Treatments / Results  ?Labs ?(all labs ordered are listed, but only abnormal results are displayed) ?Labs Reviewed - No data to display ? ?EKG ? ? ?Radiology ?CT ABDOMEN PELVIS W  CONTRAST ? ?Result Date: 12/07/2021 ?CLINICAL DATA:  Left lower quadrant abdomen pain with nausea since yesterday. EXAM: CT ABDOMEN AND PELVIS WITH CONTRAST TECHNIQUE: Multidetector CT imaging of the abdomen and pelvis was performed using the standard protocol following bolus administration of intravenous contrast. RADIATION DOSE REDUCTION: This exam was performed according to the departmental dose-optimization program which includes automated exposure control, adjustment of the mA and/or kV according to patient size and/or use of iterative reconstruction technique. CONTRAST:  OMNIPAQUE IOHEXOL 300 MG/ML  SOLN COMPARISON:  None. FINDINGS: Lower chest: No acute abnormality. Hepatobiliary: No focal liver abnormality is seen. No gallstones, gallbladder wall thickening, or biliary dilatation. Pancreas: Unremarkable. No pancreatic ductal dilatation or surrounding inflammatory changes. Spleen: Normal in size without focal abnormality. Adrenals/Urinary Tract: Adrenal glands are unremarkable. Kidneys are normal, without renal calculi, focal lesion, or hydronephrosis. Bladder is unremarkable. Stomach/Bowel: Abnormal thick walled sigmoid colon is noted. Dilated abnormal thick wall small bowel loops in the right lower quadrant are noted. The appendix is normal. The stomach is normal. Vascular/Lymphatic: No significant vascular findings are present. No enlarged abdominal or pelvic lymph nodes. Reproductive: Prostate is unremarkable. Other: None. Musculoskeletal: Mild degenerative joint changes of lower thoracic spine are noted. IMPRESSION: 1. Abnormal thick walled sigmoid colon with dilated abnormal thick wall small bowel loops in the right lower quadrant. This is nonspecific but can be seen in infectious or inflammatory colitis. Correlation regarding history inflammatory bowel disease is suggested. 2. Normal appendix. Electronically Signed   By: Sherian Rein M.D.   On: 12/07/2021 11:08   ? ?Procedures ?Procedures  (including critical care time) ? ?Medications Ordered in UC ?Medications - No data to display ? ?Initial Impression / Assessment and Plan / UC Course  ?I have reviewed the triage vital signs and the nursing notes. ? ?Pertinent labs & imaging results that were available during my care of the patient were reviewed by me and considered in my medical decision making (see chart for details). ? ?  ? ?MDM: 1.  Generalized abdominal pain: CT of abdomen and pelvis with contrast revealed above. Advised/informed patient of CT of abdomen and pelvis with contrast results today with hard copy provided to patient.  Advised patient follow-up with PCP for GI referral for further  evaluation.  Patient discharged home, hemodynamically stable. ?Final Clinical Impressions(s) / UC Diagnoses  ? ?Final diagnoses:  ?Generalized abdominal pain  ? ? ? ?Discharge Instructions   ? ?  ?Advised/informed patient of CT of abdomen and pelvis with contrast results today with hard copy provided to patient.  Advised patient follow-up with PCP for GI referral for further evaluation. ? ? ? ? ?ED Prescriptions   ?None ?  ? ?PDMP not reviewed this encounter. ?  ?Trevor IhaRagan, [place] Jr, FNP ?12/07/21 1143 ? ?

## 2021-12-07 NOTE — ED Triage Notes (Signed)
Pt states that he does have nausea as well.  ?

## 2021-12-07 NOTE — ED Triage Notes (Signed)
Pt states that he has some abdominal pain that comes and goes. X2 days ? ? ?

## 2021-12-11 ENCOUNTER — Ambulatory Visit
Admission: EM | Admit: 2021-12-11 | Discharge: 2021-12-11 | Disposition: A | Discharge status: Home / Self Care | Payer: Managed Care, Other (non HMO) | Attending: Internal Medicine | Admitting: Internal Medicine

## 2021-12-11 DIAGNOSIS — Z113 Encounter for screening for infections with a predominantly sexual mode of transmission: Secondary | ICD-10-CM

## 2021-12-11 MED ORDER — METRONIDAZOLE 500 MG PO TABS
2000.0000 mg | ORAL_TABLET | Freq: Once | ORAL | Status: AC
  Administered 2021-12-11: 2000 mg via ORAL

## 2021-12-11 NOTE — ED Provider Notes (Signed)
?[facility] ? ? ? ?CSN: [id number] ?Arrival date & time: 12/11/21  1322 ? ? ?  ? ?History   ?Chief Complaint ?Chief Complaint  ?Patient presents with  ? Body Fluid Exposure  ? ? ?HPI ?Leonard Barrett is a 41 y.o. male.  ? ?Patient presents with penile discharge that has been present for approximately 5 days.  Patient recently tested positive for trichomonas and was treated with metronidazole.  Although, patient reports that he thinks that he had sexual intercourse with previous sexual partner too early before her treatment and his treatment and they may have reinfected themselves.  Denies any new sexual partners.  Denies any other associated symptoms including dysuria, urinary frequency, back pain, fever, abdominal pain. ? ? ?Body Fluid Exposure ? ?Past Medical History:  ?Diagnosis Date  ? No pertinent past medical history   ? ? ?Patient Active Problem List  ? Diagnosis Date Noted  ? Leukocytosis 04/22/2021  ? Erythrocytosis 04/22/2021  ? NSTEMI (non-ST elevated myocardial infarction) (HCC) 04/06/2016  ? ? ?Past Surgical History:  ?Procedure Laterality Date  ? PENECTOMY    ? ? ? ? ? ?Home Medications   ? ?Prior to Admission medications   ?Not on File  ? ? ?Family History ?Family History  ?Problem Relation Age of Onset  ? Crohn's disease Mother   ? Lung disease Father   ? Diabetes Paternal Grandfather   ? CAD Neg Hx   ? ? ?Social History ?Social History  ? ?Tobacco Use  ? Smoking status: Every Day  ?  Years: 20.00  ?  Types: Cigarettes  ? Smokeless tobacco: Current  ?  Types: Chew  ? Tobacco comments:  ?  Occassionally  ?Substance Use Topics  ? Alcohol use: Yes  ?  Alcohol/week: 2.0 standard drinks  ?  Types: 2 Standard drinks or equivalent per week  ?  Comment: rarely  ? Drug use: Not Currently  ? ? ? ?Allergies   ?Other and Triaminic fever reducer [acetaminophen] ? ? ?Review of Systems ?Review of Systems ?Per HPI ? ?Physical Exam ?Triage Vital Signs ?ED Triage Vitals  ?Enc Vitals Group  ?   BP 12/11/21 1337  121/79  ?   Pulse Rate 12/11/21 1337 (!) 59  ?   Resp 12/11/21 1337 18  ?   Temp 12/11/21 1337 98.2 ?F (36.8 ?C)  ?   Temp Source 12/11/21 1337 Oral  ?   SpO2 12/11/21 1337 97 %  ?   Weight --   ?   Height --   ?   Head Circumference --   ?   Peak Flow --   ?   Pain Score 12/11/21 1336 3  ?   Pain Loc --   ?   Pain Edu? --   ?   Excl. in [place]? --   ? ?No data found. ? ?Updated Vital Signs ?BP 121/79 (BP Location: Right Arm)   Pulse (!) 59   Temp 98.2 ?F (36.8 ?C) (Oral)   Resp 18   SpO2 97%  ? ?Visual Acuity ?Right Eye Distance:   ?Left Eye Distance:   ?Bilateral Distance:   ? ?Right Eye Near:   ?Left Eye Near:    ?Bilateral Near:    ? ?Physical Exam ?Constitutional:   ?   General: He is not in acute distress. ?   Appearance: Normal appearance. He is not toxic-appearing or diaphoretic.  ?HENT:  ?   Head: Normocephalic and atraumatic.  ?Eyes:  ?  Extraocular Movements: Extraocular movements intact.  ?   Conjunctiva/sclera: Conjunctivae normal.  ?Pulmonary:  ?   Effort: Pulmonary effort is normal.  ?Genitourinary: ?   Comments: Deferred with shared decision making.  Self swab performed. ?Neurological:  ?   General: No focal deficit present.  ?   Mental Status: He is alert and oriented to person, place, and time. Mental status is at baseline.  ?Psychiatric:     ?   Mood and Affect: Mood normal.     ?   Behavior: Behavior normal.     ?   Thought Content: Thought content normal.     ?   Judgment: Judgment normal.  ? ? ? ?UC Treatments / Results  ?Labs ?(all labs ordered are listed, but only abnormal results are displayed) ?Labs Reviewed  ?CYTOLOGY, (ORAL, ANAL, URETHRAL) ANCILLARY ONLY  ? ? ?EKG ? ? ?Radiology ?No results found. ? ?Procedures ?Procedures (including critical care time) ? ?Medications Ordered in UC ?Medications  ?metroNIDAZOLE (FLAGYL) tablet 2,000 mg (has no administration in time range)  ? ? ?Initial Impression / Assessment and Plan / UC Course  ?I have reviewed the triage vital signs and the nursing  notes. ? ?Pertinent labs & imaging results that were available during my care of the patient were reviewed by me and considered in my medical decision making (see chart for details). ? ?  ? ?Highly suspicious that patient could have reinfection of trichomonas.  Will opt to treat with 2 g of Flagyl here in urgent care today.  Cytology swab pending.  Will await results for any further treatment.  Patient to refrain from sexual activity until test results and treatment are complete.  Discussed return precautions.  Patient verbalized understanding and was agreeable with plan. ?Final Clinical Impressions(s) / UC Diagnoses  ? ?Final diagnoses:  ?Screening examination for venereal disease  ? ? ? ?Discharge Instructions   ? ?  ?You are being treated today for possible trichomonas exposure again.  Your STD swab is pending.  We will call if it is positive and treat as appropriate.  Please refrain from sexual activity for at least 7 days following treatment. ? ? ? ?ED Prescriptions   ?None ?  ? ?PDMP not reviewed this encounter. ?  ?Gustavus Bryant, [place] ?12/11/21 1351 ? ?

## 2021-12-11 NOTE — ED Triage Notes (Signed)
Patient presents to Urgent Care with complaints of previous sti infection since last visit. Patient reports he doesn't think it has cleared up.  ? ?

## 2021-12-11 NOTE — Discharge Instructions (Signed)
You are being treated today for possible trichomonas exposure again.  Your STD swab is pending.  We will call if it is positive and treat as appropriate.  Please refrain from sexual activity for at least 7 days following treatment. ?

## 2021-12-14 LAB — CYTOLOGY, (ORAL, ANAL, URETHRAL) ANCILLARY ONLY
Chlamydia: NEGATIVE
Comment: NEGATIVE
Comment: NEGATIVE
Comment: NORMAL
Neisseria Gonorrhea: NEGATIVE
Trichomonas: POSITIVE — AB

## 2022-03-22 ENCOUNTER — Telehealth: Payer: Self-pay | Admitting: Physician Assistant

## 2022-03-22 NOTE — Telephone Encounter (Signed)
Per 8/14 provider pal called pt about appointment needing to be r/s   I gave him a date and he said he would call back after he checked his calender

## 2022-04-23 ENCOUNTER — Other Ambulatory Visit: Payer: Managed Care, Other (non HMO)

## 2022-04-23 ENCOUNTER — Ambulatory Visit: Payer: Managed Care, Other (non HMO) | Admitting: Physician Assistant

## 2022-04-27 ENCOUNTER — Inpatient Hospital Stay: Payer: Managed Care, Other (non HMO) | Attending: Family

## 2022-04-27 ENCOUNTER — Inpatient Hospital Stay: Payer: Managed Care, Other (non HMO) | Admitting: Physician Assistant

## 2022-08-10 ENCOUNTER — Ambulatory Visit
Admission: EM | Admit: 2022-08-10 | Discharge: 2022-08-10 | Disposition: A | Discharge status: Home / Self Care | Payer: Managed Care, Other (non HMO) | Attending: Family Medicine | Admitting: Family Medicine

## 2022-08-10 DIAGNOSIS — K047 Periapical abscess without sinus: Secondary | ICD-10-CM | POA: Diagnosis not present

## 2022-08-10 DIAGNOSIS — R519 Headache, unspecified: Secondary | ICD-10-CM | POA: Diagnosis not present

## 2022-08-10 DIAGNOSIS — H6691 Otitis media, unspecified, right ear: Secondary | ICD-10-CM

## 2022-08-10 MED ORDER — AMOXICILLIN-POT CLAVULANATE 875-125 MG PO TABS: 1.0000 | ORAL_TABLET | Freq: Two times a day (BID) | ORAL | 0 refills | Status: DC

## 2022-08-10 MED ORDER — FLUTICASONE PROPIONATE 50 MCG/ACT NA SUSP: 2.0000 | Freq: Every day | NASAL | 0 refills | Status: DC

## 2022-08-10 MED ORDER — IBUPROFEN 800 MG PO TABS: 800.0000 mg | ORAL_TABLET | Freq: Three times a day (TID) | ORAL | 0 refills | Status: DC

## 2022-08-10 MED ORDER — ACETAMINOPHEN 325 MG PO TABS
650.0000 mg | ORAL_TABLET | Freq: Once | ORAL | Status: AC
  Administered 2022-08-10: 650 mg via ORAL

## 2022-08-10 NOTE — ED Triage Notes (Signed)
Pt presents with c/o left ear fullness and, jaw, and facial pain. Pt states he has an appointment with the dentist today

## 2022-08-10 NOTE — Discharge Instructions (Signed)
Make sure you are drinking lots of water Use the Flonase 2 times a day for 2 days, then use it once a day until your symptoms have improved Take ibuprofen 3 times a day with food.  Take as needed for pain Take the Augmentin antibiotic 2 times a day.  Take 2 doses today See your doctor if you fail to improve in a week

## 2022-08-15 NOTE — ED Provider Notes (Signed)
[facility]    CSN: [id number] Arrival date & time: 08/10/22  1034      History   Chief Complaint Chief Complaint  Patient presents with   Ear Fullness   Facial Pain    HPI Leonard Barrett is a 42 y.o. male.   HPI  Patient is here today with pain in the left side of his jaw, left ear fullness, and some swelling in his face.  He has an appointment with a dentist.  He feels like he has an infection. No trauma or injury.  No recent cold symptoms.  Past Medical History:  Diagnosis Date   No pertinent past medical history     Patient Active Problem List   Diagnosis Date Noted   Leukocytosis 04/22/2021   Erythrocytosis 04/22/2021   NSTEMI (non-ST elevated myocardial infarction) ([place]) 04/06/2016    Past Surgical History:  Procedure Laterality Date   PENECTOMY         Home Medications    Prior to Admission medications   Medication Sig Start Date End Date Taking? Authorizing Provider  amoxicillin-clavulanate (AUGMENTIN) 875-125 MG tablet Take 1 tablet by mouth every 12 (twelve) hours. 08/10/22  Yes Raylene Everts, MD  fluticasone [facility]) 50 MCG/ACT nasal spray Place 2 sprays into both nostrils daily. 08/10/22  Yes Raylene Everts, MD  ibuprofen (ADVIL) 800 MG tablet Take 1 tablet (800 mg total) by mouth 3 (three) times daily. 08/10/22  Yes Raylene Everts, MD    Family History Family History  Problem Relation Age of Onset   Crohn's disease Mother    Lung disease Father    Diabetes Paternal Grandfather    CAD Neg Hx     Social History Social History   Tobacco Use   Smoking status: Every Day    Years: 20.00    Types: Cigarettes   Smokeless tobacco: Current    Types: Chew   Tobacco comments:    Occassionally  Substance Use Topics   Alcohol use: Yes    Alcohol/week: 2.0 standard drinks of alcohol    Types: 2 Standard drinks or equivalent per week    Comment: rarely   Drug use: Not Currently     Allergies   Other and Triaminic fever  reducer [acetaminophen]   Review of Systems Review of Systems See HPI  Physical Exam Triage Vital Signs ED Triage Vitals  Enc Vitals Group     BP 08/10/22 1042 133/89     Pulse Rate 08/10/22 1042 66     Resp 08/10/22 1042 12     Temp 08/10/22 1042 98.2 F (36.8 C)     Temp Source 08/10/22 1042 Oral     SpO2 08/10/22 1042 97 %     Weight --      Height --      Head Circumference --      Peak Flow --      Pain Score 08/10/22 1040 10     Pain Loc --      Pain Edu? --      Excl. in [place]? --    No data found.  Updated Vital Signs BP 133/89 (BP Location: Left Arm)   Pulse 66   Temp 98.2 F (36.8 C) (Oral)   Resp 12   SpO2 97%      Physical Exam Constitutional:      General: He is not in acute distress.    Appearance: He is well-developed.  HENT:     Head:  Normocephalic and atraumatic.     Left Ear: Tympanic membrane and ear canal normal.     Ears:     Comments: Right TM is dull with injection around the periphery    Nose: No congestion.     Mouth/Throat:     Comments: Patient has periodontal disease and poor dental condition.  There is erythema of the gums in the left upper side.  Faint swelling in left cheek.  No sinus tenderness or nasal congestion Eyes:     Conjunctiva/sclera: Conjunctivae normal.     Pupils: Pupils are equal, round, and reactive to light.  Cardiovascular:     Rate and Leonard: Normal rate.  Pulmonary:     Effort: Pulmonary effort is normal. No respiratory distress.  Abdominal:     General: There is no distension.     Palpations: Abdomen is soft.  Musculoskeletal:        General: Normal range of motion.     Cervical back: Normal range of motion.  Lymphadenopathy:     Cervical: Cervical adenopathy present.  Skin:    General: Skin is warm and dry.  Neurological:     Mental Status: He is alert.      UC Treatments / Results  Labs (all labs ordered are listed, but only abnormal results are displayed) Labs Reviewed - No data to  display  EKG   Radiology No results found.  Procedures Procedures (including critical care time)  Medications Ordered in UC Medications  acetaminophen (TYLENOL) tablet 650 mg (650 mg Oral Given 08/10/22 1110)    Initial Impression / Assessment and Plan / UC Course  I have reviewed the triage vital signs and the nursing notes.  Pertinent labs & imaging results that were available during my care of the patient were reviewed by me and considered in my medical decision making (see chart for details).     Final Clinical Impressions(s) / UC Diagnoses   Final diagnoses:  Dental infection  Acute right otitis media  Right-sided face pain     Discharge Instructions      Make sure you are drinking lots of water Use the Flonase 2 times a day for 2 days, then use it once a day until your symptoms have improved Take ibuprofen 3 times a day with food.  Take as needed for pain Take the Augmentin antibiotic 2 times a day.  Take 2 doses today See your doctor if you fail to improve in a week   ED Prescriptions     Medication Sig Dispense Auth. Provider   amoxicillin-clavulanate (AUGMENTIN) 875-125 MG tablet Take 1 tablet by mouth every 12 (twelve) hours. 14 tablet Eustace Moore, MD   fluticasone [facility]) 50 MCG/ACT nasal spray Place 2 sprays into both nostrils daily. 16 g Eustace Moore, MD   ibuprofen (ADVIL) 800 MG tablet Take 1 tablet (800 mg total) by mouth 3 (three) times daily. 21 tablet Eustace Moore, MD      PDMP not reviewed this encounter.   Eustace Moore, MD 08/15/22 [phone]

## 2022-09-03 ENCOUNTER — Ambulatory Visit
Admission: EM | Admit: 2022-09-03 | Discharge: 2022-09-03 | Disposition: A | Discharge status: Home / Self Care | Payer: Managed Care, Other (non HMO) | Attending: Urgent Care | Admitting: Urgent Care

## 2022-09-03 ENCOUNTER — Encounter: Payer: Self-pay | Admitting: Emergency Medicine

## 2022-09-03 DIAGNOSIS — H65195 Other acute nonsuppurative otitis media, recurrent, left ear: Secondary | ICD-10-CM | POA: Diagnosis not present

## 2022-09-03 DIAGNOSIS — H6991 Unspecified Eustachian tube disorder, right ear: Secondary | ICD-10-CM | POA: Diagnosis not present

## 2022-09-03 MED ORDER — PREDNISONE 20 MG PO TABS: 40.0000 mg | ORAL_TABLET | Freq: Every day | ORAL | 0 refills | Status: DC

## 2022-09-03 MED ORDER — CETIRIZINE HCL 10 MG PO TABS: 10.0000 mg | ORAL_TABLET | Freq: Every day | ORAL | 0 refills | Status: DC

## 2022-09-03 MED ORDER — PSEUDOEPHEDRINE HCL 60 MG PO TABS: 60.0000 mg | ORAL_TABLET | Freq: Three times a day (TID) | ORAL | 0 refills | Status: DC | PRN

## 2022-09-03 MED ORDER — CEFDINIR 300 MG PO CAPS: 300.0000 mg | ORAL_CAPSULE | Freq: Two times a day (BID) | ORAL | 0 refills | Status: DC

## 2022-09-03 NOTE — Discharge Instructions (Signed)
Keep taking Flonase. Start taking Zyrtec daily as well. For this flare of eustachian tube dysfunction, use prednisone for 5 days. For the secondary infection, take cefdinir. After you finish prednisone you can use pseudoephedrine as needed. Follow up with ENT.

## 2022-09-03 NOTE — ED Provider Notes (Signed)
[place]  Note:  This document was prepared using Dragon voice recognition software and may include unintentional dictation errors.  MRN: [id number] DOB: September 01, 1980  Subjective:   Leonard Barrett is a 42 y.o. male presenting for several week history of persistent and recurrent left ear pain. Was seen 08/10/2022, had similar symptoms including facial pain. Was treated for this with Augmentin. Started on Flonase, advised to use ibuprofen as needed. Patient took all medications as prescribed. He did well but shortly after his antibiotic course was done, symptoms returned including ear pain, fullness. Has not seen ENT. Does not have a history of persistent ear infections. Dental issues are resolved. He does smoke.   No current facility-administered medications for this encounter.  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 Does not know name of drug.   Triaminic Fever Reducer [Acetaminophen] Rash    Patient reports that he takes tylenol (acetaminophen) without a problem    Past Medical History:  Diagnosis Date   No pertinent past medical history      Past Surgical History:  Procedure Laterality Date   PENECTOMY      Family History  Problem Relation Age of Onset   Crohn's disease Mother    Lung disease Father    Diabetes Paternal Grandfather    CAD Neg Hx     Social History   Tobacco Use   Smoking status: Every Day    Years: 20.00    Types:  Cigarettes   Smokeless tobacco: Current    Types: Chew   Tobacco comments:    Occassionally  Substance Use Topics   Alcohol use: Yes    Alcohol/week: 2.0 standard drinks of alcohol    Types: 2 Standard drinks or equivalent per week    Comment: rarely   Drug use: Not Currently    ROS   Objective:   Vitals: BP 129/78 (BP Location: Left Arm)   Pulse 62   Temp 98.1 F (36.7 C) (Oral)   Resp 16   Ht 6' 0.5" (1.842 m)   Wt 200 lb (90.7 kg)   SpO2 96%   BMI 26.75 kg/m   Physical Exam Constitutional:      General: He is not in acute distress.    Appearance: Normal appearance. He is well-developed and normal weight. He is not ill-appearing, toxic-appearing or diaphoretic.  HENT:     Head: Normocephalic and atraumatic.     Right Ear: Tympanic membrane, ear canal and external ear normal. There is no impacted cerumen.     Left Ear: Tympanic membrane, ear canal and external ear normal. There is no impacted cerumen.     Nose: Nose normal.     Mouth/Throat:     Pharynx: Oropharynx is clear.  Eyes:     General: No scleral icterus.       Right eye: No discharge.        Left eye: No discharge.  Extraocular Movements: Extraocular movements intact.  Cardiovascular:     Rate and Rhythm: Normal rate.  Pulmonary:     Effort: Pulmonary effort is normal.  Musculoskeletal:     Cervical back: Normal range of motion.  Neurological:     Mental Status: He is alert and oriented to person, place, and time.  Psychiatric:        Mood and Affect: Mood normal.        Behavior: Behavior normal.        Thought Content: Thought content normal.        Judgment: Judgment normal.     Assessment and Plan :   PDMP not reviewed this encounter.  1. Other recurrent acute nonsuppurative otitis media of left ear   2. Eustachian tube dysfunction, right     Suspect primary issue is ETD. Recommended maintaining Flonase, add Zyrtec. Will use prednisone to address his currently persistent flare of  ETD. Use Sudafed prn thereafter. Will use cefdinir to address secondary OM, middle ear infection. Follow up with ENT. Counseled patient on potential for adverse effects with medications prescribed/recommended today, ER and return-to-clinic precautions discussed, patient verbalized understanding.    Jaynee Eagles, [place] 09/03/22 [phone]

## 2022-09-03 NOTE — ED Triage Notes (Signed)
Patient c/o about continued left ear pain for several weeks.  Patient denies any OTC pain meds.

## 2022-09-04 ENCOUNTER — Telehealth: Payer: Self-pay

## 2022-09-04 NOTE — Telephone Encounter (Signed)
Called patient regarding recent urgent care visit. Patient states he has not started his treatment yet. Patient reports he has no further questions or concerns regarding the recent visit.

## 2022-09-30 ENCOUNTER — Ambulatory Visit
Admission: EM | Admit: 2022-09-30 | Discharge: 2022-09-30 | Disposition: A | Discharge status: Home / Self Care | Payer: Managed Care, Other (non HMO) | Attending: Family Medicine | Admitting: Family Medicine

## 2022-09-30 ENCOUNTER — Encounter: Payer: Self-pay | Admitting: Emergency Medicine

## 2022-09-30 DIAGNOSIS — N5089 Other specified disorders of the male genital organs: Secondary | ICD-10-CM | POA: Diagnosis not present

## 2022-09-30 DIAGNOSIS — N50819 Testicular pain, unspecified: Secondary | ICD-10-CM | POA: Diagnosis not present

## 2022-09-30 MED ORDER — KETOROLAC TROMETHAMINE 30 MG/ML IJ SOLN
60.0000 mg | Freq: Once | INTRAMUSCULAR | Status: AC
  Administered 2022-09-30: 60 mg via INTRAMUSCULAR

## 2022-09-30 MED ORDER — IBUPROFEN 800 MG PO TABS: 800.0000 mg | ORAL_TABLET | Freq: Three times a day (TID) | ORAL | 0 refills | Status: AC

## 2022-09-30 MED ORDER — DOXYCYCLINE HYCLATE 100 MG PO CAPS: 100.0000 mg | ORAL_CAPSULE | Freq: Two times a day (BID) | ORAL | 0 refills | Status: DC

## 2022-09-30 NOTE — Discharge Instructions (Signed)
I have prescribed antibiotic doxycycline.  Take 1 pill 2 times a day.  Take this with food. I have refilled your ibuprofen.  Take 3 times a day with food.  This is to help with the pain  You have gotten a shot of Toradol.  This also will help with pain.  Should help you get through the night  You need to have an ultrasound.  I prefer you have an ultrasound tonight and we can arrange for this at a bigger [facility].  If you do not go tonight, please come here tomorrow for your ultrasound.  You must be here before 4:00

## 2022-09-30 NOTE — ED Triage Notes (Signed)
Patient c/o left sided swelling in testicle.  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  Patient states that his son landed right in between in his legs x 4 days ago causing pain and discomfort.  Denies any urinary sx's.  Applied ice and taken Ibuprofen.

## 2022-09-30 NOTE — ED Provider Notes (Signed)
[facility]    CSN: [id number] Arrival date & time: 09/30/22  1650      History   Chief Complaint Chief Complaint  Patient presents with   Groin Swelling    HPI Leonard Barrett is a 42 y.o. male.   HPI  Past Medical History:  Diagnosis Date   No pertinent past medical history     Patient Active Problem List   Diagnosis Date Noted   Leukocytosis 04/22/2021   Erythrocytosis 04/22/2021   NSTEMI (non-ST elevated myocardial infarction) ([place]) 04/06/2016    Past Surgical History:  Procedure Laterality Date   PENECTOMY         Home Medications    Prior to Admission medications   Medication Sig Start Date End Date Taking? Authorizing Provider  doxycycline (VIBRAMYCIN) 100 MG capsule Take 1 capsule (100 mg total) by mouth 2 (two) times daily. 09/30/22  Yes Raylene Everts, MD  ibuprofen (ADVIL) 800 MG tablet Take 1 tablet (800 mg total) by mouth 3 (three) times daily. 09/30/22   Raylene Everts, MD    Family History Family History  Problem Relation Age of Onset   Crohn's disease Mother    Lung disease Father    Diabetes Paternal Grandfather    CAD Neg Hx     Social History Social History   Tobacco Use   Smoking status: Every Day    Years: 20.00    Types: Cigarettes   Smokeless tobacco: Former    Types: Chew   Tobacco comments:    Occassionally  Vaping Use   Vaping Use: Never used  Substance Use Topics   Alcohol use: Yes    Alcohol/week: 2.0 standard drinks of alcohol    Types: 2 Standard drinks or equivalent per week    Comment: rarely   Drug use: Not Currently     Allergies   Other and Triaminic fever reducer [acetaminophen]   Review of Systems Review of Systems   Physical Exam Triage Vital Signs ED Triage Vitals  Enc Vitals Group     BP 09/30/22 1725 138/86     Pulse Rate 09/30/22 1725 71     Resp 09/30/22 1725 18     Temp 09/30/22 1725 98.6 F (37 C)     Temp Source 09/30/22 1725 Oral     SpO2 09/30/22 1725 98 %      Weight 09/30/22 1727 205 lb (93 kg)     Height 09/30/22 1727 6' (1.829 m)     Head Circumference --      Peak Flow --      Pain Score 09/30/22 1727 8     Pain Loc --      Pain Edu? --      Excl. in [place]? --    No data found.  Updated Vital Signs BP 138/86 (BP Location: Right Arm)   Pulse 71   Temp 98.6 F (37 C) (Oral)   Resp 18   Ht 6' (1.829 m)   Wt 93 kg   SpO2 98%   BMI 27.80 kg/m      Physical Exam Constitutional:      General: He is in acute distress.     Appearance: He is well-developed.  HENT:     Head: Normocephalic and atraumatic.  Eyes:     Conjunctiva/sclera: Conjunctivae normal.     Pupils: Pupils are equal, round, and reactive to light.  Cardiovascular:     Rate and Rhythm: Normal rate.  Pulmonary:  Effort: Pulmonary effort is normal. No respiratory distress.  Abdominal:     General: There is no distension.     Palpations: Abdomen is soft.  Genitourinary:    Comments: Patient is acute comfortable.  Walks with his legs spread apart.  On examination the left testicle is grossly swollen.  Twice the size of the right.  There is an indurated region at the upper pole.  Acutely tender.  Cannot appreciate the epididymis or collecting tubules. Musculoskeletal:        General: Normal range of motion.     Cervical back: Normal range of motion.  Skin:    General: Skin is warm and dry.  Neurological:     Mental Status: He is alert.      UC Treatments / Results  Labs (all labs ordered are listed, but only abnormal results are displayed) Labs Reviewed - No data to display  EKG   Radiology No results found.  Procedures Procedures (including critical care time)  Medications Ordered in UC Medications  ketorolac (TORADOL) 30 MG/ML injection 60 mg (60 mg Intramuscular Given 09/30/22 1816)    Initial Impression / Assessment and Plan / UC Course  I have reviewed the triage vital signs and the nursing notes.  Pertinent labs & imaging results that  were available during my care of the patient were reviewed by me and considered in my medical decision making (see chart for details).     I tried to get patient to go to an emergency room for an ultrasound of his scrotum.  He absolutely refused.  He states he will come back here tomorrow for an ultrasound.  He did agree if he gets worse tonight instead of better that he will go to an emergency room.  I have concerns that it has been 4 days and he still has this point swelling and pain.  He had direct trauma to the testicle.  I well cover him with antibiotics, ibuprofen for pain, and a shot of Toradol for right now.  Ultrasound is ordered with a call report to the PA covering the clinic tomorrow Final Clinical Impressions(s) / UC Diagnoses   Final diagnoses:  Pain in testicle due to trauma  Testicular swelling, left     Discharge Instructions      I have prescribed antibiotic doxycycline.  Take 1 pill 2 times a day.  Take this with food. I have refilled your ibuprofen.  Take 3 times a day with food.  This is to help with the pain  You have gotten a shot of Toradol.  This also will help with pain.  Should help you get through the night  You need to have an ultrasound.  I prefer you have an ultrasound tonight and we can arrange for this at a bigger [facility].  If you do not go tonight, please come here tomorrow for your ultrasound.  You must be here before 4:00    ED Prescriptions     Medication Sig Dispense Auth. Provider   ibuprofen (ADVIL) 800 MG tablet Take 1 tablet (800 mg total) by mouth 3 (three) times daily. 21 tablet Raylene Everts, MD   doxycycline (VIBRAMYCIN) 100 MG capsule Take 1 capsule (100 mg total) by mouth 2 (two) times daily. 14 capsule Raylene Everts, MD      PDMP not reviewed this encounter.   Raylene Everts, MD 09/30/22 [phone]

## 2022-10-01 ENCOUNTER — Other Ambulatory Visit: Payer: Managed Care, Other (non HMO)

## 2022-10-01 ENCOUNTER — Telehealth: Payer: Self-pay | Admitting: Emergency Medicine

## 2022-10-01 DIAGNOSIS — N5089 Other specified disorders of the male genital organs: Secondary | ICD-10-CM

## 2022-10-01 DIAGNOSIS — N50819 Testicular pain, unspecified: Secondary | ICD-10-CM

## 2022-10-01 NOTE — Telephone Encounter (Signed)
Order for Scrotal US not placed as a future order - pt to return today for [place] due to trauma to left testicle. Order placed from Dr Meda Coffee for [place] today

## 2022-11-15 IMAGING — CT CT ABD-PELV W/ CM
2 of 4 series · 15 of 46 positions shown, 17 images · IV contrast (APPLIED)
Comparison: None.

CLINICAL DATA: Left lower quadrant abdomen pain with nausea since
yesterday.

EXAM:
CT ABDOMEN AND PELVIS WITH CONTRAST
TECHNIQUE: Multidetector CT imaging of the abdomen and pelvis was performed
using the standard protocol following bolus administration of
intravenous contrast.

[Series 2: axial st · axial · 0.73mm/px · z∈[-486,+39]mm · 12 of 117 slices shown, 14 images]
[im 6/117  soft-tissue]
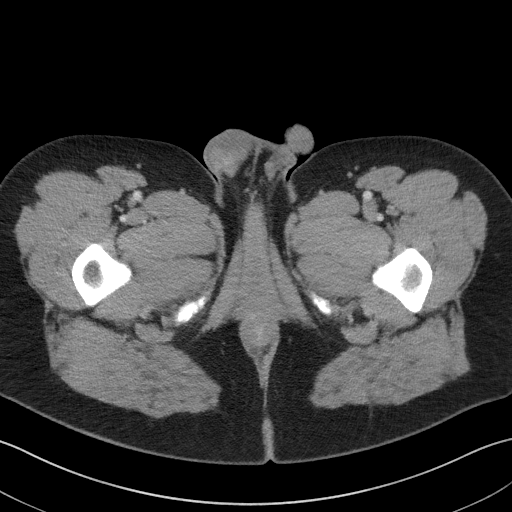
[im 6/117  bone]
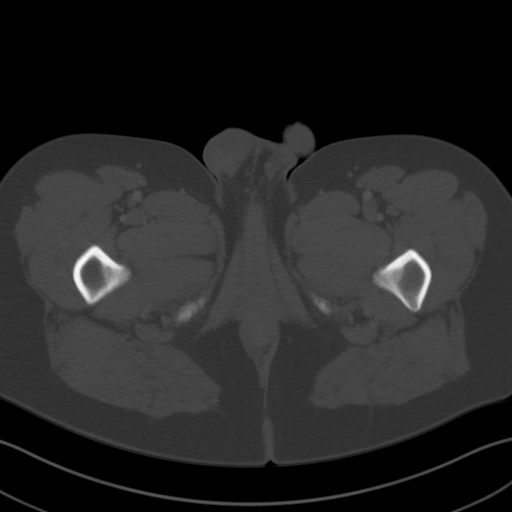
[im 16/117  soft-tissue]
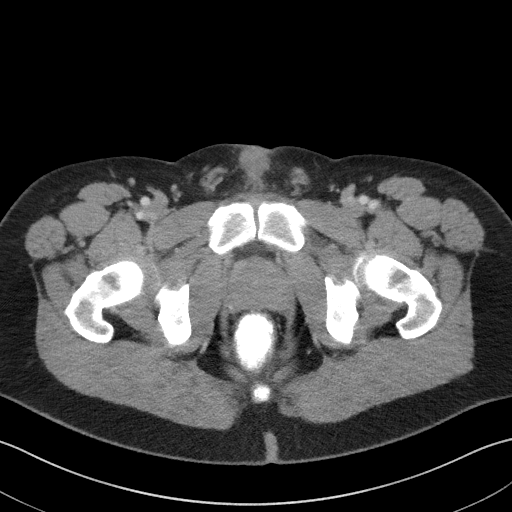
[im 26/117  soft-tissue]
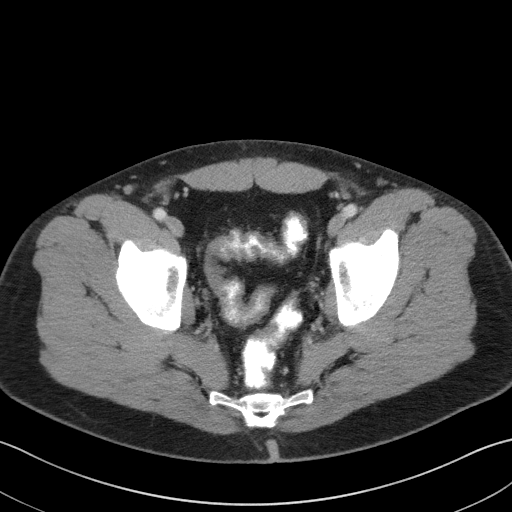
[im 36/117  soft-tissue]
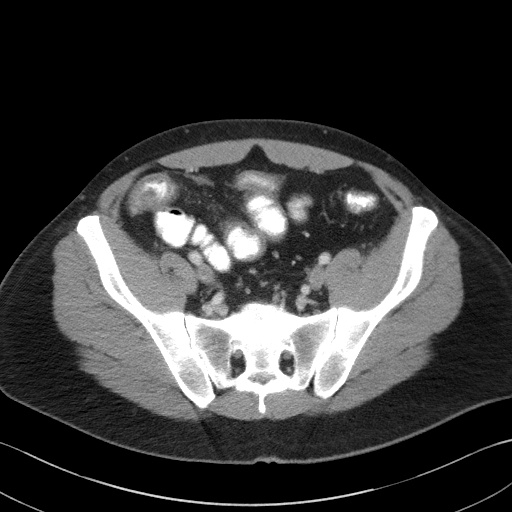
[im 46/117  soft-tissue]
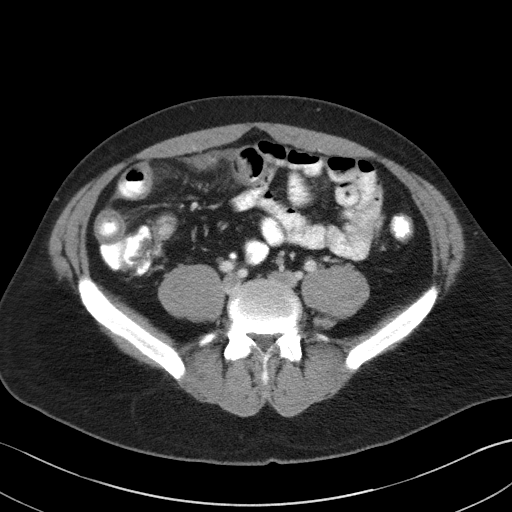
[im 56/117  soft-tissue]
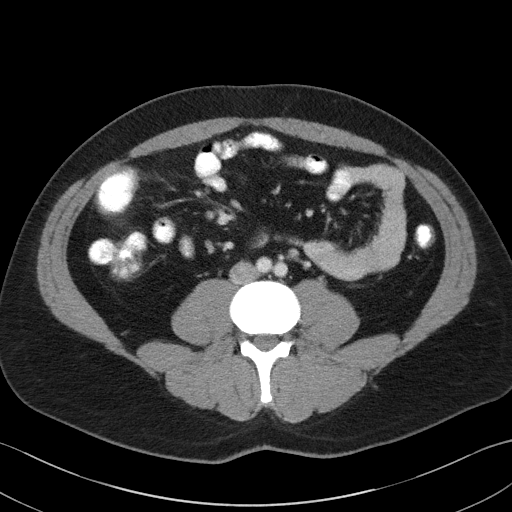
[im 61/117  soft-tissue]
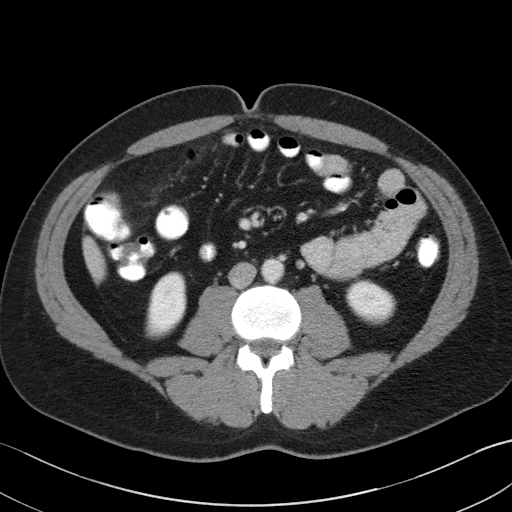
[im 71/117  soft-tissue]
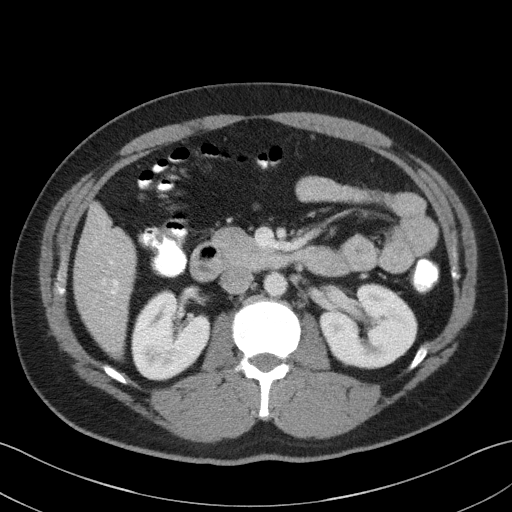
[im 81/117  soft-tissue]
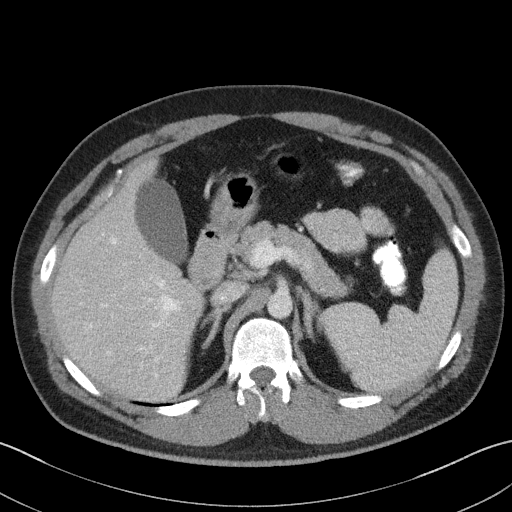
[im 81/117  bone]
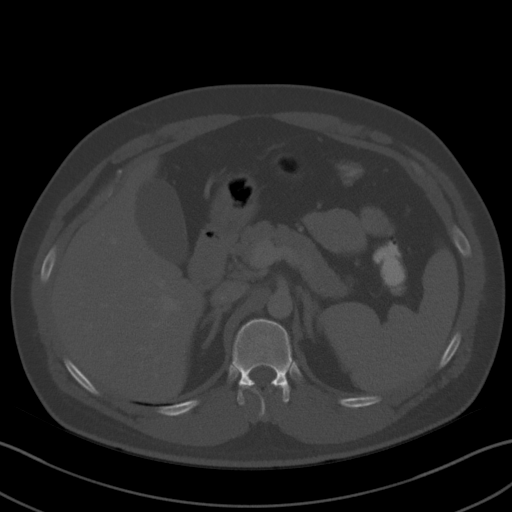
[im 91/117  soft-tissue]
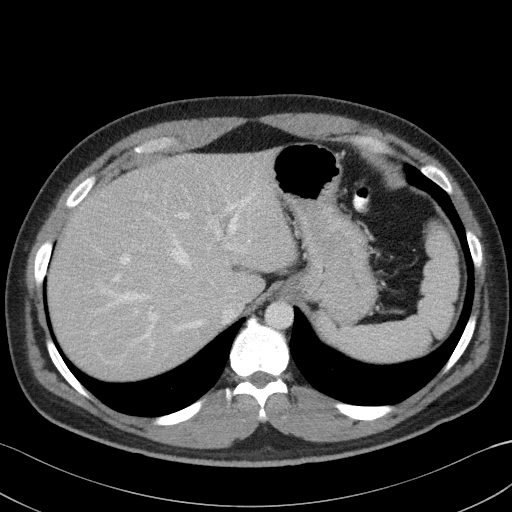
[im 101/117  soft-tissue]
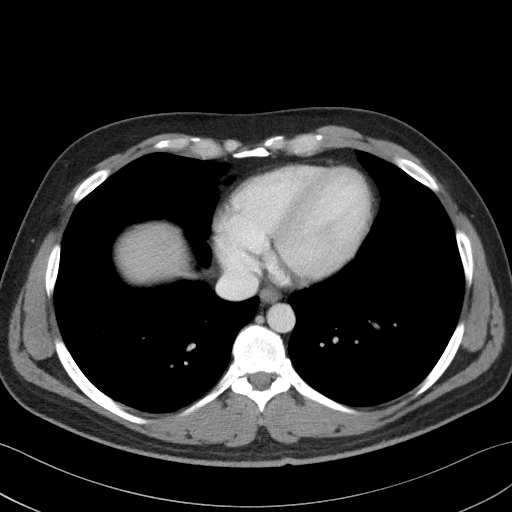
[im 111/117  soft-tissue]
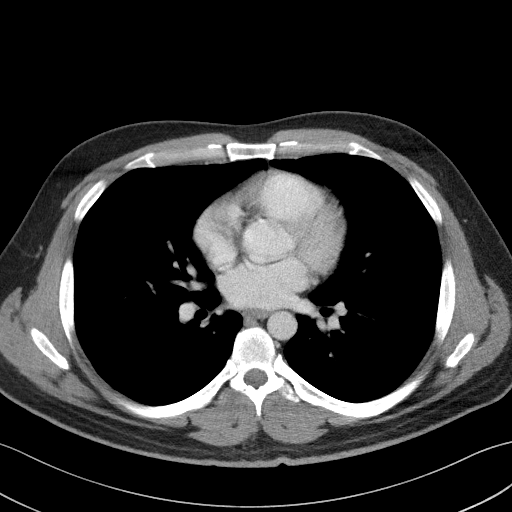

[Series 5: coronal st · coronal · 0.70mm/px · 3 of 88 slices shown]
[im 30/88  soft-tissue]
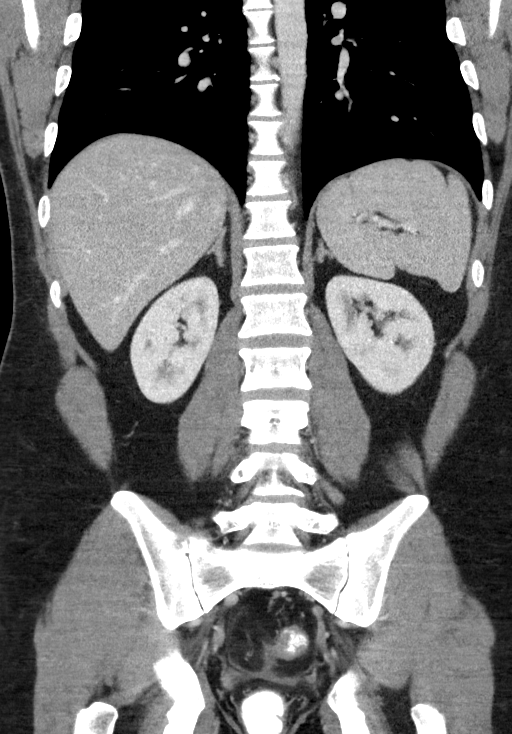
[im 39/88  soft-tissue]
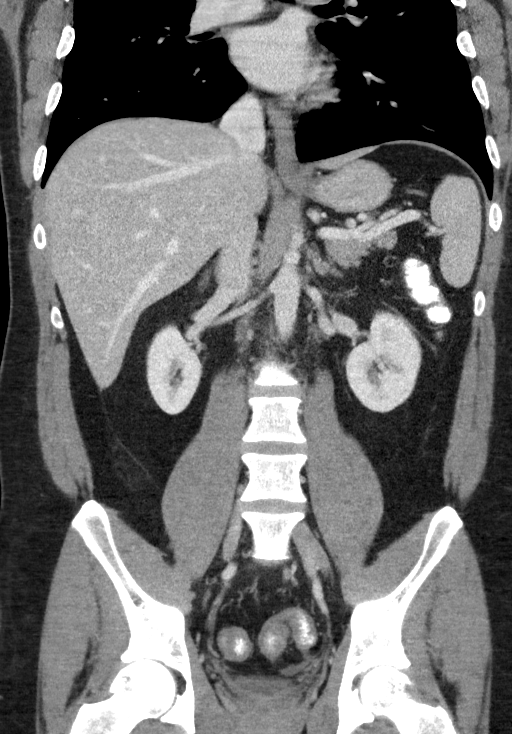
[im 49/88  soft-tissue]
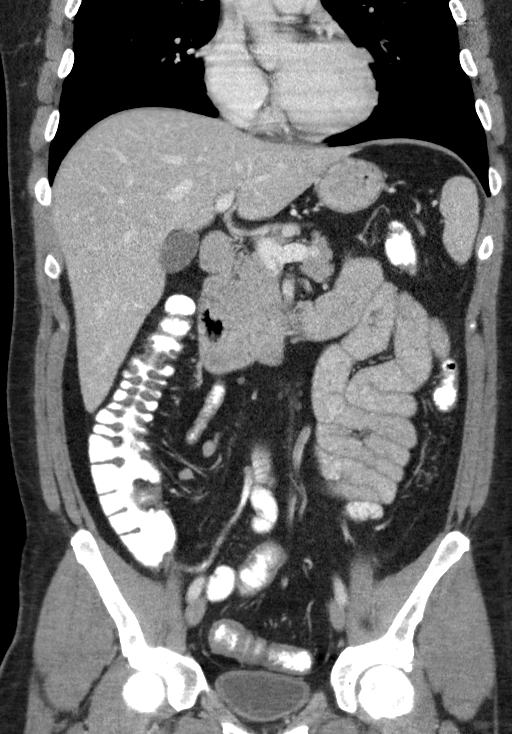

[15 of 46 positions shown; findings below may reference images not displayed]

RADIATION DOSE REDUCTION: This exam was performed according to the
departmental dose-optimization program which includes automated
exposure control, adjustment of the mA and/or kV according to
patient size and/or use of iterative reconstruction technique.

CONTRAST:  100mL OMNIPAQUE IOHEXOL 300 MG/ML  SOLN
FINDINGS: Lower chest: No acute abnormality.

Hepatobiliary: No focal liver abnormality is seen. No gallstones,
gallbladder wall thickening, or biliary dilatation.

Pancreas: Unremarkable. No pancreatic ductal dilatation or
surrounding inflammatory changes.

Spleen: Normal in size without focal abnormality.

Adrenals/Urinary Tract: Adrenal glands are unremarkable. Kidneys are
normal, without renal calculi, focal lesion, or hydronephrosis.
Bladder is unremarkable.

Stomach/Bowel: Abnormal thick walled sigmoid colon is noted. Dilated
abnormal thick wall small bowel loops in the right lower quadrant
are noted. The appendix is normal. The stomach is normal.

Vascular/Lymphatic: No significant vascular findings are present. No
enlarged abdominal or pelvic lymph nodes.

Reproductive: Prostate is unremarkable.

Other: None.

Musculoskeletal: Mild degenerative joint changes of lower thoracic
spine are noted.
IMPRESSION: 1. Abnormal thick walled sigmoid colon with dilated abnormal thick
wall small bowel loops in the right lower quadrant. This is
nonspecific but can be seen in infectious or inflammatory colitis.
Correlation regarding history inflammatory bowel disease is
suggested.
2. Normal appendix.

## 2022-11-28 ENCOUNTER — Ambulatory Visit
Admission: EM | Admit: 2022-11-28 | Discharge: 2022-11-28 | Disposition: A | Discharge status: Home / Self Care | Payer: Managed Care, Other (non HMO) | Attending: Internal Medicine | Admitting: Internal Medicine

## 2022-11-28 DIAGNOSIS — J029 Acute pharyngitis, unspecified: Secondary | ICD-10-CM | POA: Insufficient documentation

## 2022-11-28 LAB — POCT RAPID STREP A (OFFICE): Rapid Strep A Screen: NEGATIVE

## 2022-11-28 MED ORDER — AMOXICILLIN-POT CLAVULANATE 875-125 MG PO TABS: 1.0000 | ORAL_TABLET | Freq: Two times a day (BID) | ORAL | 0 refills | Status: DC

## 2022-11-28 NOTE — Discharge Instructions (Signed)
Rapid strep was negative but I am still suspicious of strep throat as we discussed so an antibiotic has been prescribed.  Take this with food.  Follow-up if any symptoms persist or worsen.

## 2022-11-28 NOTE — ED Triage Notes (Signed)
Pt c/o sore throat onset ~ thurs

## 2022-11-28 NOTE — ED Provider Notes (Signed)
[facility]    CSN: [id number] Arrival date & time: 11/28/22  1213      History   Chief Complaint Chief Complaint  Patient presents with   Sore Throat    HPI Leonard Barrett is a 42 y.o. male.   Patient presents with sore throat that started about 2 days ago.  He denies any associated nasal congestion, runny nose, cough, fever.  Although, he does report that he felt feverish a few nights prior as he had "sweats".  Denies any known sick contacts.  He has not had any medications for symptoms.   Sore Throat    Past Medical History:  Diagnosis Date   No pertinent past medical history     Patient Active Problem List   Diagnosis Date Noted   Leukocytosis 04/22/2021   Erythrocytosis 04/22/2021   NSTEMI (non-ST elevated myocardial infarction) 04/06/2016    Past Surgical History:  Procedure Laterality Date   PENECTOMY         Home Medications    Prior to Admission medications   Medication Sig Start Date End Date Taking? Authorizing Provider  amoxicillin-clavulanate (AUGMENTIN) 875-125 MG tablet Take 1 tablet by mouth every 12 (twelve) hours. 11/28/22  Yes Miro Balderson, Rolly Salter E, FNP  ibuprofen (ADVIL) 800 MG tablet Take 1 tablet (800 mg total) by mouth 3 (three) times daily. 09/30/22   Eustace Moore, MD    Family History Family History  Problem Relation Age of Onset   Crohn's disease Mother    Lung disease Father    Diabetes Paternal Grandfather    CAD Neg Hx     Social History Social History   Tobacco Use   Smoking status: Every Day    Years: 20    Types: Cigarettes   Smokeless tobacco: Former    Types: Chew   Tobacco comments:    Occassionally  Vaping Use   Vaping Use: Never used  Substance Use Topics   Alcohol use: Yes    Alcohol/week: 2.0 standard drinks of alcohol    Types: 2 Standard drinks or equivalent per week    Comment: rarely   Drug use: Not Currently     Allergies   Other and Triaminic fever reducer  [acetaminophen]   Review of Systems Review of Systems Per HPI  Physical Exam Triage Vital Signs ED Triage Vitals  Enc Vitals Group     BP 11/28/22 1344 121/77     Pulse Rate 11/28/22 1343 70     Resp 11/28/22 1343 16     Temp 11/28/22 1343 98.5 F (36.9 C)     Temp Source 11/28/22 1343 Oral     SpO2 11/28/22 1343 97 %     Weight --      Height --      Head Circumference --      Peak Flow --      Pain Score 11/28/22 1344 5     Pain Loc --      Pain Edu? --      Excl. in GC? --    No data found.  Updated Vital Signs BP 121/77   Pulse 70   Temp 98.5 F (36.9 C) (Oral)   Resp 16   SpO2 97%   Visual Acuity Right Eye Distance:   Left Eye Distance:   Bilateral Distance:    Right Eye Near:   Left Eye Near:    Bilateral Near:     Physical Exam Constitutional:  General: He is not in acute distress.    Appearance: Normal appearance. He is not toxic-appearing or diaphoretic.  HENT:     Head: Normocephalic and atraumatic.     Right Ear: Tympanic membrane and ear canal normal.     Left Ear: Tympanic membrane and ear canal normal.     Nose: No congestion.     Mouth/Throat:     Mouth: Mucous membranes are moist.     Pharynx: Oropharyngeal exudate and posterior oropharyngeal erythema present.     Tonsils: No tonsillar exudate or tonsillar abscesses.  Eyes:     Extraocular Movements: Extraocular movements intact.     Conjunctiva/sclera: Conjunctivae normal.     Pupils: Pupils are equal, round, and reactive to light.  Cardiovascular:     Rate and Rhythm: Normal rate and regular rhythm.     Pulses: Normal pulses.     Heart sounds: Normal heart sounds.  Pulmonary:     Effort: Pulmonary effort is normal. No respiratory distress.     Breath sounds: Normal breath sounds. No wheezing.  Abdominal:     General: Abdomen is flat. Bowel sounds are normal.     Palpations: Abdomen is soft.  Musculoskeletal:        General: Normal range of motion.     Cervical back:  Normal range of motion.  Skin:    General: Skin is warm and dry.  Neurological:     General: No focal deficit present.     Mental Status: He is alert and oriented to person, place, and time. Mental status is at baseline.  Psychiatric:        Mood and Affect: Mood normal.        Behavior: Behavior normal.      UC Treatments / Results  Labs (all labs ordered are listed, but only abnormal results are displayed) Labs Reviewed  CULTURE, GROUP A STREP [facility])  POCT RAPID STREP A (OFFICE)    EKG   Radiology No results found.  Procedures Procedures (including critical care time)  Medications Ordered in UC Medications - No data to display  Initial Impression / Assessment and Plan / UC Course  I have reviewed the triage vital signs and the nursing notes.  Pertinent labs & imaging results that were available during my care of the patient were reviewed by me and considered in my medical decision making (see chart for details).     Rapid strep was negative but I am still highly suspicious of strep throat given appearance of posterior pharynx on exam.  Therefore, will opt to treat with Augmentin antibiotic.  No signs of peritonsillar abscess on exam.  Advised strict return precautions.  Discussed with patient the possibility that viral illness could be etiology as opposed to strep throat throat.  Discussed symptom management associated with this.  Offered COVID testing but patient declined.  Throat culture pending.  Patient verbalized understanding and was agreeable with plan. Final Clinical Impressions(s) / UC Diagnoses   Final diagnoses:  Acute pharyngitis, unspecified etiology     Discharge Instructions      Rapid strep was negative but I am still suspicious of strep throat as we discussed so an antibiotic has been prescribed.  Take this with food.  Follow-up if any symptoms persist or worsen.    ED Prescriptions     Medication Sig Dispense Auth. Provider    amoxicillin-clavulanate (AUGMENTIN) 875-125 MG tablet Take 1 tablet by mouth every 12 (twelve) hours. 14 tablet Belle, [place] E,  FNP      PDMP not reviewed this encounter.   Gustavus Bryant, [place] 11/28/22 1425

## 2022-12-01 LAB — CULTURE, GROUP A STREP (THRC)

## 2023-04-03 ENCOUNTER — Encounter (HOSPITAL_COMMUNITY): Payer: Self-pay

## 2023-04-03 ENCOUNTER — Other Ambulatory Visit: Payer: Self-pay

## 2023-04-03 ENCOUNTER — Emergency Department (HOSPITAL_COMMUNITY)
Admission: EM | Admit: 2023-04-03 | Discharge: 2023-04-03 | Disposition: A | Discharge status: Home / Self Care | Payer: Managed Care, Other (non HMO) | Attending: Emergency Medicine | Admitting: Emergency Medicine

## 2023-04-03 ENCOUNTER — Emergency Department (HOSPITAL_COMMUNITY): Payer: Managed Care, Other (non HMO)

## 2023-04-03 DIAGNOSIS — R109 Unspecified abdominal pain: Secondary | ICD-10-CM | POA: Diagnosis present

## 2023-04-03 DIAGNOSIS — K659 Peritonitis, unspecified: Secondary | ICD-10-CM | POA: Diagnosis not present

## 2023-04-03 DIAGNOSIS — K6389 Other specified diseases of intestine: Secondary | ICD-10-CM

## 2023-04-03 LAB — CBC WITH DIFFERENTIAL/PLATELET
Abs Immature Granulocytes: 0.13 10*3/uL — ABNORMAL HIGH (ref 0.00–0.07)
Basophils Absolute: 0.1 10*3/uL (ref 0.0–0.1)
Basophils Relative: 1 %
Eosinophils Absolute: 0.2 10*3/uL (ref 0.0–0.5)
Eosinophils Relative: 2 %
HCT: 49.7 % (ref 39.0–52.0)
Hemoglobin: 16.6 g/dL (ref 13.0–17.0)
Immature Granulocytes: 1 %
Lymphocytes Relative: 25 %
Lymphs Abs: 2.6 10*3/uL (ref 0.7–4.0)
MCH: 29 pg (ref 26.0–34.0)
MCHC: 33.4 g/dL (ref 30.0–36.0)
MCV: 86.7 fL (ref 80.0–100.0)
Monocytes Absolute: 0.8 10*3/uL (ref 0.1–1.0)
Monocytes Relative: 7 %
Neutro Abs: 6.7 10*3/uL (ref 1.7–7.7)
Neutrophils Relative %: 64 %
Platelets: 303 10*3/uL (ref 150–400)
RBC: 5.73 MIL/uL (ref 4.22–5.81)
RDW: 12.8 % (ref 11.5–15.5)
WBC: 10.4 10*3/uL (ref 4.0–10.5)
nRBC: 0 % (ref 0.0–0.2)

## 2023-04-03 LAB — URINALYSIS, ROUTINE W REFLEX MICROSCOPIC
Bilirubin Urine: NEGATIVE
Glucose, UA: NEGATIVE mg/dL
Hgb urine dipstick: NEGATIVE
Ketones, ur: NEGATIVE mg/dL
Leukocytes,Ua: NEGATIVE
Nitrite: NEGATIVE
Protein, ur: NEGATIVE mg/dL
Specific Gravity, Urine: >1.046 — ABNORMAL HIGH (ref 1.005–1.030)
pH: 5 (ref 5.0–8.0)

## 2023-04-03 LAB — LIPASE, BLOOD: Lipase: 26 U/L (ref 11–51)

## 2023-04-03 LAB — COMPREHENSIVE METABOLIC PANEL
ALT: 34 U/L (ref 0–44)
AST: 23 U/L (ref 15–41)
Albumin: 3.7 g/dL (ref 3.5–5.0)
Alkaline Phosphatase: 43 U/L (ref 38–126)
Anion gap: 14 (ref 5–15)
BUN: 12 mg/dL (ref 6–20)
CO2: 22 mmol/L (ref 22–32)
Calcium: 8.8 mg/dL — ABNORMAL LOW (ref 8.9–10.3)
Chloride: 103 mmol/L (ref 98–111)
Creatinine, Ser: 1.02 mg/dL (ref 0.61–1.24)
GFR, Estimated: >60 mL/min (ref >60)
Glucose, Bld: 94 mg/dL (ref 70–99)
Potassium: 3.8 mmol/L (ref 3.5–5.1)
Sodium: 139 mmol/L (ref 135–145)
Total Bilirubin: 2.2 mg/dL — ABNORMAL HIGH (ref 0.3–1.2)
Total Protein: 6.7 g/dL (ref 6.5–8.1)

## 2023-04-03 MED ORDER — SODIUM CHLORIDE 0.9 % IV BOLUS
1000.0000 mL | Freq: Once | INTRAVENOUS | Status: AC
  Administered 2023-04-03: 1000 mL via INTRAVENOUS

## 2023-04-03 MED ORDER — MORPHINE SULFATE (PF) 4 MG/ML IV SOLN
5.0000 mg | Freq: Once | INTRAVENOUS | Status: AC
  Administered 2023-04-03: 5 mg via INTRAVENOUS
  Filled 2023-04-03: qty 2

## 2023-04-03 MED ORDER — ONDANSETRON HCL 4 MG/2ML IJ SOLN
4.0000 mg | Freq: Once | INTRAMUSCULAR | Status: AC
  Administered 2023-04-03: 4 mg via INTRAVENOUS
  Filled 2023-04-03: qty 2

## 2023-04-03 MED ORDER — IOHEXOL 350 MG/ML SOLN
75.0000 mL | Freq: Once | INTRAVENOUS | Status: AC | PRN
  Administered 2023-04-03: 75 mL via INTRAVENOUS

## 2023-04-03 MED ORDER — KETOROLAC TROMETHAMINE 30 MG/ML IJ SOLN
15.0000 mg | Freq: Once | INTRAMUSCULAR | Status: AC
  Administered 2023-04-03: 15 mg via INTRAVENOUS
  Filled 2023-04-03: qty 1

## 2023-04-03 NOTE — ED Provider Notes (Signed)
[place] EMERGENCY DEPARTMENT AT [facility] Provider Note   CSN: [id number] Arrival date & time: 04/03/23  3244     History  Chief Complaint  Patient presents with   Abdominal Pain   Flank Pain   Nausea   HPI Leonard Barrett is a 42 y.o. male with h/o NSTEMI in 2017 presenting for flank pain.  Started Friday and was acutely worse this morning.  Started in the left flank now more in the left upper quadrant of the abdomen.  It feels like a stabbing pain.  Endorses some nausea but no vomiting diarrhea.  Having normal bowel movement this morning.  Denies any urinary changes.  Denies fever and chills.  Denies history of a kidney stone.   Abdominal Pain Flank Pain Associated symptoms include abdominal pain.       Home Medications Prior to Admission medications   Medication Sig Start Date End Date Taking? Authorizing Provider  amoxicillin-clavulanate (AUGMENTIN) 875-125 MG tablet Take 1 tablet by mouth every 12 (twelve) hours. 11/28/22   Gustavus Bryant, FNP  ibuprofen (ADVIL) 800 MG tablet Take 1 tablet (800 mg total) by mouth 3 (three) times daily. 09/30/22   Eustace Moore, MD      Allergies    Other and Triaminic fever reducer [acetaminophen]    Review of Systems   Review of Systems  Gastrointestinal:  Positive for abdominal pain.  Genitourinary:  Positive for flank pain.    Physical Exam Updated Vital Signs BP 120/75 (BP Location: Left Arm)   Pulse 60   Temp 98.3 F (36.8 C) (Oral)   Resp 18   Ht 6' (1.829 m)   Wt 99.8 kg   SpO2 97%   BMI 29.84 kg/m  Physical Exam Vitals and nursing note reviewed.  HENT:     Head: Normocephalic and atraumatic.     Mouth/Throat:     Mouth: Mucous membranes are moist.  Eyes:     General:        Right eye: No discharge.        Left eye: No discharge.     Conjunctiva/sclera: Conjunctivae normal.  Cardiovascular:     Rate and Rhythm: Normal rate and regular rhythm.     Pulses: Normal pulses.     Heart sounds:  Normal heart sounds.  Pulmonary:     Effort: Pulmonary effort is normal.     Breath sounds: Normal breath sounds.  Abdominal:     General: Abdomen is flat.     Palpations: Abdomen is soft.     Tenderness: There is abdominal tenderness in the left upper quadrant. There is no left CVA tenderness.  Skin:    General: Skin is warm and dry.  Neurological:     General: No focal deficit present.  Psychiatric:        Mood and Affect: Mood normal.     ED Results / Procedures / Treatments   Labs (all labs ordered are listed, but only abnormal results are displayed) Labs Reviewed  CBC WITH DIFFERENTIAL/PLATELET - Abnormal; Notable for the following components:      Result Value   Abs Immature Granulocytes 0.13 (*)    All other components within normal limits  COMPREHENSIVE METABOLIC PANEL - Abnormal; Notable for the following components:   Calcium 8.8 (*)    Total Bilirubin 2.2 (*)    All other components within normal limits  URINALYSIS, ROUTINE W REFLEX MICROSCOPIC - Abnormal; Notable for the following components:   Specific Gravity,  Urine >1.046 (*)    All other components within normal limits  LIPASE, BLOOD    EKG None  Radiology CT ABDOMEN PELVIS W CONTRAST  Result Date: 04/03/2023 CLINICAL DATA:  Abdominal/flank pain with stone suspected EXAM: CT ABDOMEN AND PELVIS WITH CONTRAST TECHNIQUE: Multidetector CT imaging of the abdomen and pelvis was performed using the standard protocol following bolus administration of intravenous contrast. RADIATION DOSE REDUCTION: This exam was performed according to the departmental dose-optimization program which includes automated exposure control, adjustment of the mA and/or kV according to patient size and/or use of iterative reconstruction technique. CONTRAST:  75mL OMNIPAQUE IOHEXOL 350 MG/ML SOLN COMPARISON:  12/07/2021 FINDINGS: Lower chest:  Bands of opacity at the bases attributed atelectasis. Hepatobiliary: No focal liver abnormality.No  evidence of biliary obstruction or stone. Pancreas: Unremarkable. Spleen: Unremarkable. Adrenals/Urinary Tract: Negative adrenals. No hydronephrosis or stone. Unremarkable bladder. Stomach/Bowel: Fat inflammation ventral to the splenic flexure surrounds a fatty lobule, consistent with epiploic appendagitis. No generalized colonic diverticulosis. No appendicitis or bowel obstruction. Vascular/Lymphatic: No acute vascular abnormality. No mass or adenopathy. Reproductive:No pathologic findings. Other: No ascites or pneumoperitoneum. Musculoskeletal: No acute abnormalities. IMPRESSION: 1. Epiploic appendagitis centered at the splenic flexure. 2. Subsegmental atelectasis at the lung bases. Electronically Signed   By: Tiburcio Pea M.D.   On: 04/03/2023 11:44    Procedures Procedures    Medications Ordered in ED Medications  morphine (PF) 4 MG/ML injection 5 mg (5 mg Intravenous Given 04/03/23 1036)  sodium chloride 0.9 % bolus 1,000 mL (1,000 mLs Intravenous New Bag/Given 04/03/23 1044)  ondansetron (ZOFRAN) injection 4 mg (4 mg Intravenous Given 04/03/23 1038)  iohexol (OMNIPAQUE) 350 MG/ML injection 75 mL (75 mLs Intravenous Contrast Given 04/03/23 1100)  ketorolac (TORADOL) 30 MG/ML injection 15 mg (15 mg Intravenous Given 04/03/23 1215)    ED Course/ Medical Decision Making/ A&P                                 Medical Decision Making Amount and/or Complexity of Data Reviewed Labs: ordered. Radiology: ordered.  Risk Prescription drug management.   Initial Impression and Ddx 42 year old well-appearing male presenting for abdominal pain.  Exam notable for left upper quadrant tenderness.  DDx includes nephrolithiasis, pyelonephritis, diverticulitis, UTI other. Patient PMH that increases complexity of ED encounter: h/o NSTEMI in 2017  Interpretation of Diagnostics - I independent reviewed and interpreted the labs as followed: No acute findings  - I independently visualized the following  imaging with scope of interpretation limited to determining acute life threatening conditions related to emergency care: CT ab/pelvis, which revealed  Epiploic appendagitis  Patient Reassessment and Ultimate Disposition/Management Workup and clinical findings supports diagnosis of epiploic appendagitis.  After treatment patient stated he felt much better.  Advised him that this is usually self-limiting condition and to treat conservatively at home along with NSAIDs.  Recommended him to follow-up PCP.  Discussed plan return precautions.  Vital stable.  Discharged home in condition.  Patient management required discussion with the following services or consulting groups:  None  Complexity of Problems Addressed Acute complicated illness or Injury  Additional Data Reviewed and Analyzed Further history obtained from: Further history from spouse/family member, Past medical history and medications listed in the EMR, and Prior ED visit notes  Patient Encounter Risk Assessment None         Final Clinical Impression(s) / ED Diagnoses Final diagnoses:  Epiploic appendagitis    Rx /  DC Orders ED Discharge Orders     None         Gareth Eagle, PA-C 04/03/23 1239    Melene Plan, DO 04/03/23 1242

## 2023-04-03 NOTE — ED Triage Notes (Signed)
Pt c/o LUQ pain w/ intermittent shooting pains x 2 days, L flank pain stating this morning and nausea.  Pain score 6/10.

## 2023-04-03 NOTE — Discharge Instructions (Addendum)
Evaluation today revealed that you have epiploic appendagitis this is inflammation of the appendages that connects your bowel to the bowel wall. Treatment is mostly conservative and you can also take ibuprofen as needed for your symptoms. Symptoms last 1 to 2 weeks.

## 2023-11-22 ENCOUNTER — Encounter: Payer: Self-pay | Admitting: Emergency Medicine

## 2023-11-22 ENCOUNTER — Ambulatory Visit
Admission: EM | Admit: 2023-11-22 | Discharge: 2023-11-22 | Disposition: A | Discharge status: Home / Self Care | Attending: Family Medicine | Admitting: Family Medicine

## 2023-11-22 DIAGNOSIS — J029 Acute pharyngitis, unspecified: Secondary | ICD-10-CM

## 2023-11-22 LAB — POCT RAPID STREP A (OFFICE): Rapid Strep A Screen: NEGATIVE

## 2023-11-22 MED ORDER — AMOXICILLIN-POT CLAVULANATE 875-125 MG PO TABS: 1.0000 | ORAL_TABLET | Freq: Two times a day (BID) | ORAL | 0 refills | Status: AC

## 2023-11-22 NOTE — ED Provider Notes (Signed)
 [facility]    CSN: [id number] Arrival date & time: 11/22/23  2130      History   Chief Complaint Chief Complaint  Patient presents with   Sore Throat    HPI Leonard Barrett is a 43 y.o. male.    Sore Throat  Patient present today with 2 days of worsening sore throat, pain with swallowing, and feeling unwell.  2 weeks ago he had a upper respiratory illness that completely resolved.  2 days ago he developed some mild throat pain which has accelerated over the last 2 days.  He denies fever.  He has had a low-grade temp on arrival here today.  Denies that anyone else is sick currently at home.  Past Medical History:  Diagnosis Date   No pertinent past medical history     Patient Active Problem List   Diagnosis Date Noted   Leukocytosis 04/22/2021   Erythrocytosis 04/22/2021   NSTEMI (non-ST elevated myocardial infarction) (HCC) 04/06/2016    Past Surgical History:  Procedure Laterality Date   PENECTOMY         Home Medications    Prior to Admission medications   Medication Sig Start Date End Date Taking? Authorizing Provider  amoxicillin-clavulanate (AUGMENTIN) 875-125 MG tablet Take 1 tablet by mouth every 12 (twelve) hours. 11/22/23   Bing Neighbors, NP  ibuprofen (ADVIL) 800 MG tablet Take 1 tablet (800 mg total) by mouth 3 (three) times daily. 09/30/22   Eustace Moore, MD    Family History Family History  Problem Relation Age of Onset   Crohn's disease Mother    Lung disease Father    Diabetes Paternal Grandfather    CAD Neg Hx     Social History Social History   Tobacco Use   Smoking status: Former    Types: Cigarettes    Passive exposure: Past   Smokeless tobacco: Former    Types: Chew   Tobacco comments:    Occassionally  Vaping Use   Vaping status: Never Used  Substance Use Topics   Alcohol use: Yes    Alcohol/week: 2.0 standard drinks of alcohol    Types: 2 Standard drinks or equivalent per week    Comment: rarely    Drug use: Not Currently     Allergies   Other and Triaminic fever reducer [acetaminophen]   Review of Systems Review of Systems Pertinent negatives listed in HPI  Physical Exam Triage Vital Signs ED Triage Vitals  Encounter Vitals Group     BP 11/22/23 1039 139/87     Systolic BP Percentile --      Diastolic BP Percentile --      Pulse Rate 11/22/23 1039 60     Resp 11/22/23 1039 16     Temp 11/22/23 1039 99.3 F (37.4 C)     Temp Source 11/22/23 1039 Oral     SpO2 11/22/23 1039 97 %     Weight 11/22/23 1037 220 lb 0.3 oz (99.8 kg)     Height --      Head Circumference --      Peak Flow --      Pain Score 11/22/23 1037 4     Pain Loc --      Pain Education --      Exclude from Growth Chart --    No data found.  Updated Vital Signs BP 139/87 (BP Location: Left Arm)   Pulse 60   Temp 99.3 F (37.4 C) (Oral)  Resp 16   Wt 220 lb 0.3 oz (99.8 kg)   SpO2 97%   BMI 29.84 kg/m   Visual Acuity Right Eye Distance:   Left Eye Distance:   Bilateral Distance:    Right Eye Near:   Left Eye Near:    Bilateral Near:     Physical Exam Vitals reviewed.  Constitutional:      Appearance: He is well-developed.  HENT:     Head: Normocephalic and atraumatic.     Mouth/Throat:     Pharynx: Pharyngeal swelling, posterior oropharyngeal erythema and uvula swelling present.     Tonsils: 0 on the right. 0 on the left.  Eyes:     Conjunctiva/sclera: Conjunctivae normal.     Pupils: Pupils are equal, round, and reactive to light.  Cardiovascular:     Rate and Rhythm: Normal rate and regular rhythm.  Lymphadenopathy:     Cervical: Cervical adenopathy present.  Skin:    General: Skin is warm and dry.  Neurological:     General: No focal deficit present.     Mental Status: He is alert.      UC Treatments / Results  Labs (all labs ordered are listed, but only abnormal results are displayed) Labs Reviewed  POCT RAPID STREP A (OFFICE) - Normal     EKG   Radiology No results found.  Procedures Procedures (including critical care time)  Medications Ordered in UC Medications - No data to display  Initial Impression / Assessment and Plan / UC Course  I have reviewed the triage vital signs and the nursing notes.  Pertinent labs & imaging results that were available during my care of the patient were reviewed by me and considered in my medical decision making (see chart for details).    Acute pharyngitis, rapid strep negative.  Given appearance of throat will cover empirically for possible strep with Augmentin twice daily for 7 days.  Patient encouraged to take Tylenol or ibuprofen as needed for pain.  Gargle with warm salt water to help with pain.  Return precautions given if symptoms worsen or do not improve. Final Clinical Impressions(s) / UC Diagnoses   Final diagnoses:  Acute pharyngitis, unspecified etiology   Discharge Instructions   None    ED Prescriptions     Medication Sig Dispense Auth. Provider   amoxicillin-clavulanate (AUGMENTIN) 875-125 MG tablet Take 1 tablet by mouth every 12 (twelve) hours. 14 tablet Buena Carmine, NP      PDMP not reviewed this encounter.   Buena Carmine, NP 11/22/23 1331

## 2023-11-22 NOTE — ED Triage Notes (Signed)
 Pt presents with sore throat x 2 days. Pt denies emesis and cough.
